# Patient Record
Sex: Female | Born: 1985 | Race: Black or African American | Hispanic: No | Smoking: Never smoker
Health system: Southern US, Community
[De-identification: ages and names within clinical notes are randomized; demographics above are authoritative.]

## PROBLEM LIST (undated history)

## (undated) DIAGNOSIS — R519 Headache, unspecified: Secondary | ICD-10-CM

## (undated) DIAGNOSIS — T8859XA Other complications of anesthesia, initial encounter: Secondary | ICD-10-CM

## (undated) DIAGNOSIS — D219 Benign neoplasm of connective and other soft tissue, unspecified: Secondary | ICD-10-CM

## (undated) HISTORY — DX: Other complications of anesthesia, initial encounter: T88.59XA

## (undated) HISTORY — PX: MYOMECTOMY: SHX85

## (undated) HISTORY — DX: Headache, unspecified: R51.9

---

## 2015-06-15 DIAGNOSIS — D219 Benign neoplasm of connective and other soft tissue, unspecified: Secondary | ICD-10-CM

## 2015-06-15 DIAGNOSIS — O321XX0 Maternal care for breech presentation, not applicable or unspecified: Secondary | ICD-10-CM

## 2021-07-16 ENCOUNTER — Encounter: Payer: Self-pay | Admitting: Obstetrics and Gynecology

## 2021-07-16 ENCOUNTER — Telehealth (INDEPENDENT_AMBULATORY_CARE_PROVIDER_SITE_OTHER): Payer: Medicaid Other

## 2021-07-16 DIAGNOSIS — Z3A00 Weeks of gestation of pregnancy not specified: Secondary | ICD-10-CM

## 2021-07-16 DIAGNOSIS — G43909 Migraine, unspecified, not intractable, without status migrainosus: Secondary | ICD-10-CM | POA: Insufficient documentation

## 2021-07-16 DIAGNOSIS — Z98891 History of uterine scar from previous surgery: Secondary | ICD-10-CM | POA: Insufficient documentation

## 2021-07-16 DIAGNOSIS — Z3480 Encounter for supervision of other normal pregnancy, unspecified trimester: Secondary | ICD-10-CM

## 2021-07-16 MED ORDER — BLOOD PRESSURE MONITORING DEVI: 1.0000 | 0 refills | Status: DC

## 2021-07-16 NOTE — Patient Instructions (Signed)
AREA PEDIATRIC/FAMILY PRACTICE PHYSICIANS ? ?[place] ([address]) ?[facility] ?Chambliss, MD; Eniola, MD; Hale, MD; Hensel, MD; McDiarmid, MD; McIntyer, MD; Neilah Fulwider, MD; Walden, MD ?[address] [place], [place] [address] ?[phone] ?Mon-Fri 8:30-12:30, 1:30-5:00 ?Providers come to see babies at [facility] ?Accepting Medicaid ?[facility] ?Limited providers who accept newborns: Koirala, MD; Morrow, MD; Wolters, MD ?[address], [place], [place] [address] ?[phone] ?Mon-Fri 8:00-5:30 ?Babies seen by providers at [facility] ?Does NOT accept Medicaid ?Please call early in hospitalization for appointment (limited availability)  ?[facility] ?Mulberry, MD ?[address] [place], [place] [address] ?[phone] ?Mon, Tue, Thur, Fri 8:30-5:00, Wed 10:00-7:00 (closed 1-2pm) ?Babies seen by [facility] providers ?Accepting Medicaid ?[facility] Pediatric[facility] ?Rubin, MD ?[address], [place], [place] [address] ?[phone] ?Mon-Fri 8:30-5:00, Sat 8:30-12:00 ?Provider comes to see babies at [facility] ?Accepting Medicaid ?Must have been referred from current patients or contacted office prior to delivery ?[facility] ([facility]) ?Brown, MD; Chandler, MD; Ettefagh, MD; Grant, MD; Lester, MD; McCormick, MD; McQueen, MD; Prose, MD; Simha, MD; Stanley, MD; Stryffeler, NP; Tebben, NP ?[address], [place], [place] [address] ?[phone] ?Mon, Tue, Thur, Fri 8:30-5:30, Wed 9:30-5:30, Sat 8:30-12:30 ?Babies seen by [facility] providers ?Accepting Medicaid ?Only accepting infants of first-time parents or siblings of current patients ?Hospital discharge coordinator will make follow-up appointment ?Jack Amos ?[address], [place], [place]  [address] ?[phone]   Fax - [phone] ?[facility] ?[address], [place], [place]  [address] ?Phone - [phone]   Fax - [phone] ?Shilpa Gosrani ?[address], [place], [place]  [address] ?[phone] ? ?[place] ([address]) ?[facility] ?Bates, MD; [place], MD; Cooper, Cox, MD; MD; Davis, MD; Dovico, MD; Ettefaugh, MD; Little, MD; Lowe, MD; Keiffer, MD; Melvin, MD; Sumner, MD; Williams, MD ?[address], [place], [place] [address] ?[phone] ?Mon-Fri 8:30-5:00 (extended evenings Mon-Thur as needed), Sat-Sun 10:00-1:00 ?Providers come to see babies at [facility] ?Accepting Medicaid for families of first-time babies and families with all children in the household age 3 and under. Must register with office prior to making appointment (M-F only). ?[facility] ?Henson, NP; Knapp, MD; Lalonde, MD; Tysinger, PA ?[address] [place], [place] [address] ?[phone] ?Mon-Fri 8:00-5:00 ?Babies seen by providers at [facility] ?Does NOT accept Medicaid/Commercial Insurance Only ?[facility] ([facility])  ?Artis, MD; Barnes, MD; Bratton, MD; Coccaro, MD; Lockett Gardner, MD; Kramer, MD; Marshall, MD; Netherton, MD; Poleto, MD; Skinner, MD ?[address] [place], [place] [address] ?[phone] ?Mon-Fri 8:30-5:30, Sat (Oct.-Mar.) 9:00-1:00 ?Babies seen by providers at [facility] ?Accepting Medicaid ? ?[place] ([address]) ?[facility] ?Reid, MD; Warner, MD ?[address], [place], [place] [address] ?[phone] ?Mon-Fri 8:30-5:00, Sat 8:30-12:00 ?Providers come to see babies at [facility] ?Does NOT accept Medicaid ?[facility] ?Becker, PA; Hagler, MD; Scifres, PA; Sun, MD; Swayne, MD ?[address], [place],  [address] ?[phone] ?Mon-Fri 8:00-5:00 ?Babies seen by providers at [facility] ?Does NOT accept Medicaid ?Only accepting babies of parents who  are patients ?Please call early in hospitalization for appointment (limited availability) ?[facility] ?Clark, MD; Frye, MD; Kelleher, MD; Mack, NP; Miller, MD; O'Keller, MD; Patterson, NP; Pudlo, MD; Puzio, MD; Thomas, MD; Tucker, MD; Twiselton, MD ?[address] [place], [place], [place] [address] ?[phone] ?Mon-Fri 8:00-5:00, Sat 9:00-12:00 ?Providers come to see babies at [facility] ?Does NOT accept Medicaid ? ?[facility] [phone]) ?[place] at [facility] ?Limited providers accepting new patients: Dayna Ramus, NP; [place], [place] ?[address], [place], [place] [address] ?[phone] ?Mon-Fri 8:00-5:00 ?Babies seen by providers at [facility] ?Does NOT accept Medicaid ?Only accepting babies of parents who are patients ?Please call early in hospitalization for appointment (limited availability) ?[facility] ?Abner Greenspan, MD; Sheran Lawless, MD ?[address] [place], [place] [address] ?[phone] (press [phone] to schedule appointment) ?Mon-Fri 8:00-5:00 ?Providers come to see babies at [facility] ?Does NOT accept Medicaid ?[facility] ?Mazer, MD ?[address] [place], [place] [address] ?[phone] ?Mon-Fri 8:30-5:00 (lunch 12:30-1:00), extended hours by appointment only Wed 5:00-6:30 ?Babies seen by [facility] providers ?Accepting Medicaid ?Therapist, music at [facility] ?Volanda Napoleon, MD; Martinique, MD; Ethlyn Gallery, MD ?[place], [place], [place] [address] ?[phone] ?Mon-Fri 8:00-5:00 ?Babies seen by [facility] providers ?Does NOT accept Medicaid ?Therapist, music at [employer] ?Jerline Pain, MD; Yong Channel, MD; [place], DO ?[address] [place], [place] [address] ?[phone] ?Mon-Fri 8:00-5:00 ?Babies seen by [facility] providers ?Does NOT accept Medicaid ?[facility] ?Erlene Quan, [place]; [place], [place]; Brunson, NP; Albertina Parr, MD; Frederic Jericho, MD; Ronney Lion, MD; Carlos Levering, NP; Jerelene Redden, NP; Tomasita Crumble, NP; Ronelle Nigh, NP; Corinna Lines, MD; Karsten Ro,  MD ?[address] [place], [place] [address] ?[phone] ?Mon-Fri 8:30-5:00, Sat 10:00-1:00 ?Providers come to see babies at [facility] ?Does NOT accept Medicaid ?Free prenatal information session Tuesdays at 4:45pm ?[facility] ?Coletta Memos, MD; Araceli Bouche, [place]; [place], [place]; [place], [place] ?[address] [place] [address] ?[phone] ?Mon-Fri 7:30-5:30 ?Babies seen by [facility] providers ?[facility] ?[address] [place], [place], [place]  [address] ?[phone]   Fax - [phone] ? ?[place] [phone] & [phone]) ?[place] ?Ayesha Rumpf, MD ?[address] [place], [place] [address] ?[phone] ?Mon-Thur 8:00-6:00 ?Providers come to see babies at [facility] ?Accepting Medicaid ?[place] ?Ouida Sills, NP; Melford Aase, MD; [place], [place]; [place], [place] ?[address] [place], [place] [address] ?[phone] ?Mon-Thur 7:30-7:30, Fri 7:30-4:30 ?Babies seen by [facility] providers ?Accepting Medicaid ?[facility] ?Carolynn Sayers, MD; Cristino Martes, NP; Gertie Baron, MD ?[address], [place], [place] [address] ?[phone] ?Mon-Fri 8:30-5:00, Sat 8:30-12:00 ?Providers come to see babies at [facility] ?Accepting Medicaid ?Must have ?Meet & Greet? appointment at office prior to delivery ?[place] [facility] ?Faythe Dingwall, MD; Juleen China, MD; Clydene Laming, MD ?[address], [place], [place] [address] ?[phone] ?Mon-Wed 8:00-6:00, Thur-Fri 8:00-5:00, Sat 9:00-12:00 ?Providers come to see babies at [facility] ?Does NOT accept Medicaid ?Only accepting siblings of current patients ?[facility]  ?[address], [place], [place], [place]  [address] ?[phone]   Fax - [phone] ?[place] at [facility] ?[place] [address], [place], [place]  [address] ?[phone]   Fax -  [phone] ? ?[place] [phone] & [phone]) ?Therapist, music at [employer] ?Harlin Heys, DO ?[address] [place], [place] [address] ?[phone] ?Mon-Fri 7:00-5:00 ?Babies seen by [facility]

## 2021-07-16 NOTE — Progress Notes (Signed)
Pt states is having problems sleeping due to headaches, it seems that the Kepra does not help. Pt states that he head feels very heavy also. ?

## 2021-07-16 NOTE — Progress Notes (Signed)
New OB Intake ? ?I connected with  Mackenzie Reeves on 07/16/21 at 11:15 AM EDT by MyChart Video Visit and verified that I am speaking with the correct person using two identifiers. Nurse is located at [facility] and pt is located at [employer]. ? ?I discussed the limitations, risks, security and privacy concerns of performing an evaluation and management service by telephone and the availability of in person appointments. I also discussed with the patient that there may be a patient responsible charge related to this service. The patient expressed understanding and agreed to proceed. ? ?I explained I am completing New OB Intake today. We discussed her EDD of 01/21/22 that is based on LMP of 04/16/21. Pt is G2/P1. I reviewed her allergies, medications, Medical/Surgical/OB history, and appropriate screenings. I informed her of [facility] services. Based on history, this is a/an  pregnancy uncomplicated .  ? ?There are no problems to display for this patient. ? ? ?Concerns addressed today ? ?Delivery Plans:  ?Plans to deliver at [facility].  ? ?MyChart/Babyscripts ?MyChart access verified. I explained pt will have some visits in office and some virtually. Babyscripts instructions given and order placed. Patient verifies receipt of registration text/e-mail. Account successfully created and app downloaded. ? ?Blood Pressure Cuff  ?Blood pressure cuff ordered for patient to pick-up from [employer]. Explained after first prenatal appt pt will check weekly and document in 36. ? ?Weight scale: Patient does / does not  have weight scale. Weight scale ordered for patient to pick up from [employer].  ? ?Anatomy US ?Explained first scheduled [place] will be around 19 weeks. Anatomy US scheduled for 08/29/21 at 1030. Pt notified to arrive at 1015. ?Scheduled AFP lab only appointment if CenteringPregnancy pt for same day as anatomy US.  ? ?Labs ?Discussed Johnsie Cancel genetic screening with patient. Would like both Panorama and Horizon drawn  at new OB visit.Also if interested in genetic testing, tell patient she will need AFP 15-21 weeks to complete genetic testing .Routine prenatal labs needed. ? ?Covid Vaccine ?Patient has covid vaccine.  ? ?Is patient a CenteringPregnancy candidate? Accepted  "Centering Patient" indicated on sticky note ?  ?Is patient a Mom+Baby Combined Care candidate? Not a candidate   Scheduled with Mom+Baby provider  ?  ?Is patient interested in [place]? No  "Interested in [employer] - Schedule next visit with CNM" on sticky note ? ?Informed patient of [facility] website  and placed link in her AVS.  ? ?Social Determinants of Health ?Food Insecurity: Patient denies food insecurity. ?WIC Referral: Patient is interested in referral to [facility].  ?Transportation: Patient denies transportation needs. ?Childcare: Discussed no children allowed at ultrasound appointments. Offered childcare services; patient declines childcare services at this time. ? ?Send link to Pregnancy Navigators ? ? ?Placed OB Box on problem list and updated ? ?First visit review ?I reviewed new OB appt with pt. I explained she will have a pelvic exam, ob bloodwork with genetic screening, and PAP smear. Explained pt will be seen by Dr. Damita Dunnings at first visit; encounter routed to appropriate provider. Explained that patient will be seen by pregnancy navigator following visit with provider. [facility] information placed in AVS.  ? ?Bethanne Ginger, CMA ?07/16/2021  11:36 AM  ?

## 2021-07-17 DIAGNOSIS — O3680X0 Pregnancy with inconclusive fetal viability, not applicable or unspecified: Secondary | ICD-10-CM | POA: Diagnosis not present

## 2021-07-17 DIAGNOSIS — Z3201 Encounter for pregnancy test, result positive: Secondary | ICD-10-CM | POA: Diagnosis not present

## 2021-07-17 DIAGNOSIS — Z23 Encounter for immunization: Secondary | ICD-10-CM | POA: Diagnosis not present

## 2021-07-17 DIAGNOSIS — Z3A12 12 weeks gestation of pregnancy: Secondary | ICD-10-CM | POA: Diagnosis not present

## 2021-07-18 DIAGNOSIS — Z368A Encounter for antenatal screening for other genetic defects: Secondary | ICD-10-CM | POA: Diagnosis not present

## 2021-07-18 DIAGNOSIS — Z3A12 12 weeks gestation of pregnancy: Secondary | ICD-10-CM | POA: Diagnosis not present

## 2021-07-18 DIAGNOSIS — Z3401 Encounter for supervision of normal first pregnancy, first trimester: Secondary | ICD-10-CM | POA: Diagnosis not present

## 2021-07-18 DIAGNOSIS — O09522 Supervision of elderly multigravida, second trimester: Secondary | ICD-10-CM | POA: Diagnosis not present

## 2021-07-18 DIAGNOSIS — Z124 Encounter for screening for malignant neoplasm of cervix: Secondary | ICD-10-CM | POA: Diagnosis not present

## 2021-07-18 DIAGNOSIS — O219 Vomiting of pregnancy, unspecified: Secondary | ICD-10-CM | POA: Diagnosis not present

## 2021-07-18 DIAGNOSIS — G43909 Migraine, unspecified, not intractable, without status migrainosus: Secondary | ICD-10-CM | POA: Diagnosis not present

## 2021-07-18 DIAGNOSIS — N898 Other specified noninflammatory disorders of vagina: Secondary | ICD-10-CM | POA: Diagnosis not present

## 2021-07-18 DIAGNOSIS — O09521 Supervision of elderly multigravida, first trimester: Secondary | ICD-10-CM | POA: Diagnosis not present

## 2021-07-18 DIAGNOSIS — Z113 Encounter for screening for infections with a predominantly sexual mode of transmission: Secondary | ICD-10-CM | POA: Diagnosis not present

## 2021-07-18 DIAGNOSIS — R87810 Cervical high risk human papillomavirus (HPV) DNA test positive: Secondary | ICD-10-CM | POA: Diagnosis not present

## 2021-07-18 DIAGNOSIS — Z369 Encounter for antenatal screening, unspecified: Secondary | ICD-10-CM | POA: Diagnosis not present

## 2021-07-18 LAB — OB RESULTS CONSOLE HEPATITIS B SURFACE ANTIGEN: Hepatitis B Surface Ag: NEGATIVE

## 2021-07-18 LAB — OB RESULTS CONSOLE ABO/RH: RH Type: NEGATIVE

## 2021-07-18 LAB — OB RESULTS CONSOLE HIV ANTIBODY (ROUTINE TESTING): HIV: NONREACTIVE

## 2021-07-18 LAB — OB RESULTS CONSOLE ANTIBODY SCREEN: Antibody Screen: NEGATIVE

## 2021-07-18 LAB — OB RESULTS CONSOLE GC/CHLAMYDIA
Chlamydia: NEGATIVE
Neisseria Gonorrhea: NEGATIVE

## 2021-07-18 LAB — OB RESULTS CONSOLE RUBELLA ANTIBODY, IGM: Rubella: IMMUNE

## 2021-07-18 LAB — OB RESULTS CONSOLE RPR: RPR: NONREACTIVE

## 2021-07-22 DIAGNOSIS — R519 Headache, unspecified: Secondary | ICD-10-CM | POA: Diagnosis not present

## 2021-07-22 DIAGNOSIS — G8929 Other chronic pain: Secondary | ICD-10-CM | POA: Diagnosis not present

## 2021-07-23 ENCOUNTER — Telehealth: Payer: Self-pay | Admitting: Advanced Practice Midwife

## 2021-07-23 DIAGNOSIS — R519 Headache, unspecified: Secondary | ICD-10-CM | POA: Diagnosis not present

## 2021-07-23 DIAGNOSIS — Z3A13 13 weeks gestation of pregnancy: Secondary | ICD-10-CM | POA: Diagnosis not present

## 2021-07-23 NOTE — Telephone Encounter (Signed)
Patient called in to cancel all her appointments she will no longer come here.   ?

## 2021-07-24 ENCOUNTER — Telehealth: Payer: Self-pay | Admitting: Hematology and Oncology

## 2021-07-24 NOTE — Telephone Encounter (Signed)
Scheduled appt per 3/23 referral. Pt is aware of appt date and time. Pt is aware to arrive 15 mins prior to appt time and to bring and updated insurance card. Pt is aware of appt location.   ?

## 2021-07-28 DIAGNOSIS — G43719 Chronic migraine without aura, intractable, without status migrainosus: Secondary | ICD-10-CM | POA: Diagnosis not present

## 2021-07-28 DIAGNOSIS — Z79899 Other long term (current) drug therapy: Secondary | ICD-10-CM | POA: Diagnosis not present

## 2021-07-29 ENCOUNTER — Encounter: Payer: Medicaid Other | Admitting: Obstetrics and Gynecology

## 2021-08-01 DIAGNOSIS — G518 Other disorders of facial nerve: Secondary | ICD-10-CM | POA: Diagnosis not present

## 2021-08-01 DIAGNOSIS — G43719 Chronic migraine without aura, intractable, without status migrainosus: Secondary | ICD-10-CM | POA: Diagnosis not present

## 2021-08-01 DIAGNOSIS — M542 Cervicalgia: Secondary | ICD-10-CM | POA: Diagnosis not present

## 2021-08-01 DIAGNOSIS — M791 Myalgia, unspecified site: Secondary | ICD-10-CM | POA: Diagnosis not present

## 2021-08-08 ENCOUNTER — Other Ambulatory Visit: Payer: Self-pay

## 2021-08-08 ENCOUNTER — Inpatient Hospital Stay: Payer: Medicaid Other | Attending: Hematology and Oncology | Admitting: Hematology and Oncology

## 2021-08-08 ENCOUNTER — Encounter: Payer: Self-pay | Admitting: Hematology and Oncology

## 2021-08-08 ENCOUNTER — Inpatient Hospital Stay: Payer: Medicaid Other

## 2021-08-08 VITALS — BP 131/78 | HR 88 | Temp 97.9°F | Resp 16 | Ht 65.0 in | Wt 159.8 lb

## 2021-08-08 DIAGNOSIS — D582 Other hemoglobinopathies: Secondary | ICD-10-CM | POA: Insufficient documentation

## 2021-08-08 DIAGNOSIS — Z3A15 15 weeks gestation of pregnancy: Secondary | ICD-10-CM | POA: Insufficient documentation

## 2021-08-08 DIAGNOSIS — R519 Headache, unspecified: Secondary | ICD-10-CM | POA: Diagnosis not present

## 2021-08-08 DIAGNOSIS — Z79899 Other long term (current) drug therapy: Secondary | ICD-10-CM | POA: Insufficient documentation

## 2021-08-08 NOTE — Progress Notes (Signed)
[name] ?CONSULT NOTE ? ?Patient Care Team: ?[name], DO as PCP - General (Obstetrics and Gynecology) ? ?CHIEF COMPLAINTS/PURPOSE OF CONSULTATION:  ?Hemoglobinopathy ? ?ASSESSMENT & PLAN:  ? 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 ?We have discussed that hemoglobin C trait is a hemoglobinopathy which essentially causes a carrier state and is clinically asymptomatic.  We however recommend that the partner be tested because of the partner also carries a variant, the children may have hemoglobin C disease which can occasionally cause mild hemolytic anemia with high MCHC as well as gallstones, splenomegaly, jaundice etc. ?She tells me that her partner is back in [place] and they are legally separated.  She is not sure if her partner can get tested for hemoglobin C trait.  In that case she could consider testing the child after his bone for hemoglobin C.  Typically hemoglobin C does not cause vaso-occlusive events like hemoglobin S.  And if we do see any vaso-occlusive events in patients with hemoglobin C disease, we should consider evaluating them for sickle cell disease as well as hemoglobin C [place]. ?She does not need any further evaluation or treatment at this time.  She understands the risks of hemoglobin C trait as well as hemoglobin C disease.  She will talk to her partner back in [place]. ?She otherwise we will try to get the child tested after birth. ? ?No evidence of anemia which is typical for carrier state and hemoglobin C.   ?She does not need routine follow-ups with hematology.  She can return to clinic as needed. ?Thank you for consulting [place] the care of this patient.  Please not hesitate contact us with any additional questions or concerns. ? ?HISTORY OF PRESENTING ILLNESS:  ?Mackenzie Reeves 36 y.o. female is here because of Hb C trait ? 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  Patient arrived to the appointment today by herself.  She also has a 19-year-old son who is healthy.  She is doing very well from the pregnancy standpoint.  She has history of headaches for which she takes Keppra.  She otherwise denies any major health problems with her first pregnancy, needed a C-section.  No known history of anemia.  Rest of the pertinent 10 point ROS reviewed and negative. ? ?REVIEW OF SYSTEMS:   ?Constitutional: Denies fevers, chills or abnormal night sweats ?Eyes: Denies blurriness of vision, double vision or watery eyes ?Ears, nose, mouth, throat, and face: Denies mucositis or sore throat ?Respiratory: Denies cough, dyspnea or wheezes ?Cardiovascular: Denies palpitation, chest discomfort or lower extremity swelling ?Gastrointestinal:  Denies nausea, heartburn or change in bowel habits ?Skin: Denies abnormal skin rashes ?Lymphatics: Denies new lymphadenopathy or easy bruising ?Neurological:Denies numbness, tingling or new weaknesses ?Behavioral/Psych: Mood is stable, no new changes  ?All other systems were reviewed with the patient and are negative. ? ?MEDICAL HISTORY:  ?Past Medical History:  ?Diagnosis Date  ? Complication of anesthesia   ? Headache   ? ? ?SURGICAL HISTORY: ?Past Surgical History:  ?Procedure Laterality Date  ? CESAREAN SECTION    ? ? ?SOCIAL HISTORY: ?Social History  ? ?Socioeconomic History  ? Marital status: Legally Separated  ?  Spouse name: Not on file  ?  Number of children: Not on file  ? Years of education: Not on file  ? Highest education level: Not on file  ?Occupational History  ? Not on file  ?Tobacco Use  ? Smoking status: Never  ?  Passive exposure: Never  ? Smokeless tobacco: Never  ?Vaping Use  ? Vaping Use: Never used  ?Substance and Sexual  Activity  ? Alcohol use: Never  ? Drug use: Never  ? Sexual activity: Yes  ?  Birth control/protection: None  ?Other Topics Concern  ? Not on file  ?Social History Narrative  ? Not on file  ? ?Social Determinants of Health  ? ?Financial Resource Strain: Not on file  ?Food Insecurity: Not on file  ?Transportation Needs: Not on file  ?Physical Activity: Not on file  ?Stress: Not on file  ?Social Connections: Not on file  ?Intimate Partner Violence: Not on file  ? ? ?FAMILY HISTORY: ?Family History  ?Problem Relation Age of Onset  ? Diabetes Mother   ? ? ?ALLERGIES:  is allergic to diclofenac. ? ?MEDICATIONS:  ?Current Outpatient Medications  ?Medication Sig Dispense Refill  ? acetaminophen (TYLENOL) 325 MG tablet Take 650 mg by mouth every 6 (six) hours as needed.    ? Blood Pressure Monitoring DEVI 1 each by Does not apply route once a week. 1 each 0  ? levETIRAcetam (KEPPRA) 500 MG tablet Take 500 mg by mouth 2 (two) times daily.    ? Prenatal Vit-Fe Fumarate-FA (MULTIVITAMIN-PRENATAL) 27-0.8 MG TABS tablet Take 1 tablet by mouth daily at 12 noon.    ? ?No current facility-administered medications for this visit.  ? ? ? ?PHYSICAL EXAMINATION: ?ECOG PERFORMANCE STATUS: 0 - Asymptomatic ? ?Vitals:  ? 08/08/21 1058  ?BP: 131/78  ?Pulse: 88  ?Resp: 16  ?Temp: 97.9 ?F (36.6 ?C)  ?SpO2: 100%  ? ?Filed Weights  ? 08/08/21 1058  ?Weight: 159 lb 12.8 oz (72.5 kg)  ? ? ?GENERAL:alert, no distress and comfortable ?SKIN: skin color, texture, turgor are normal, no rashes or significant lesions ?EYES: normal, conjunctiva are pink and non-injected, sclera clear ?OROPHARYNX:no exudate, no erythema and lips, buccal mucosa, and tongue normal  ?NECK: supple, thyroid normal size, non-tender, without nodularity ?LYMPH:  no palpable lymphadenopathy in the cervical, axillary or inguinal ?LUNGS: clear to auscultation and percussion with normal breathing effort ?HEART: regular rate & rhythm and no murmurs and no lower extremity  edema ?ABDOMEN: Gravid uterus, no hepatosplenomegaly  ?musculoskeletal:no cyanosis of digits and no clubbing  ?PSYCH: alert & oriented x 3 with fluent speech ?NEURO: no focal motor/sensory deficits ? ?LABORATORY DATA:  ?I have reviewed the data as listed ?No results found for: WBC, HGB, HCT, MCV, PLT ?  Chemistry   ?No results found for: NA, K, CL, CO2, BUN, CREATININE, GLU No results found for: CALCIUM, ALKPHOS, AST, ALT, BILITOT  ? ?Her most recent hemoglobin electrophoresis from March 17 showed hemoglobin A at 57.1% hemoglobin A2 at 3.6% and hemoglobin C at 38.3%.  Final interpretation showed hemoglobin pattern and concentration consistent with hemoglobin C trait, heterozygous. ? ?CBC showed a hemoglobin of 12.3, normal MCV, normal WBC and platelet count ? ?RADIOGRAPHIC STUDIES: ?I have personally reviewed the radiological images as listed and agreed with the findings in the report. ?No results found. ? ?All questions were answered. The patient knows to call the clinic with any problems, questions or concerns. ?I spent 45 minutes in the care of this patient including H and P, review of records, counseling and coordination  of care. ? ?  ? Benay Pike, MD ?08/08/2021 11:08 AM ? ? ? ?

## 2021-08-27 DIAGNOSIS — G4459 Other complicated headache syndrome: Secondary | ICD-10-CM | POA: Diagnosis not present

## 2021-08-27 DIAGNOSIS — Z3A18 18 weeks gestation of pregnancy: Secondary | ICD-10-CM | POA: Diagnosis not present

## 2021-08-27 DIAGNOSIS — R9401 Abnormal electroencephalogram [EEG]: Secondary | ICD-10-CM | POA: Diagnosis not present

## 2021-08-29 ENCOUNTER — Ambulatory Visit (HOSPITAL_COMMUNITY)
Admission: RE | Admit: 2021-08-29 | Discharge: 2021-08-29 | Disposition: A | Discharge status: Home / Self Care | Payer: Medicaid Other | Source: Ambulatory Visit | Attending: Neurology | Admitting: Neurology

## 2021-08-29 ENCOUNTER — Other Ambulatory Visit (HOSPITAL_COMMUNITY): Payer: Self-pay

## 2021-08-29 ENCOUNTER — Ambulatory Visit: Payer: Medicaid Other

## 2021-08-29 ENCOUNTER — Other Ambulatory Visit: Payer: Medicaid Other

## 2021-08-29 DIAGNOSIS — R519 Headache, unspecified: Secondary | ICD-10-CM | POA: Diagnosis not present

## 2021-08-29 NOTE — Progress Notes (Signed)
EEG complete - results pending 

## 2021-08-29 NOTE — Procedures (Signed)
Patient Name: Mackenzie Reeves  ?MRN: [id number]  ?Epilepsy Attending: Lora Havens  ?Referring Physician/Provider: Melburn Popper, MD ?Date: 08/29/2021  ?Duration: 26.08 mins ? ?Patient history: 36yo F on keppra reportedly for headache. EEG to evaluate for seizure ? ?Level of alertness: Awake ? ?AEDs during EEG study: LEV ? ?Technical aspects: This EEG study was done with scalp electrodes positioned according to the 10-20 International system of electrode placement. Electrical activity was acquired at a sampling rate of '500Hz'$  and reviewed with a high frequency filter of '70Hz'$  and a low frequency filter of '1Hz'$ . EEG data were recorded continuously and digitally stored.  ? ?Description: The posterior dominant rhythm consists of '9Hz'$  activity of moderate voltage (25-35 uV) seen predominantly in posterior head regions, symmetric and reactive to eye opening and eye closing. Physiologic photic driving was seen during photic stimulation.  Hyperventilation was not performed.    ? ?IMPRESSION: ?This study is within normal limits. No seizures or epileptiform discharges were seen throughout the recording. ? ?Lora Havens  ? ?

## 2021-09-11 DIAGNOSIS — R300 Dysuria: Secondary | ICD-10-CM | POA: Diagnosis not present

## 2021-09-12 DIAGNOSIS — G4459 Other complicated headache syndrome: Secondary | ICD-10-CM | POA: Diagnosis not present

## 2021-09-12 DIAGNOSIS — Z3A18 18 weeks gestation of pregnancy: Secondary | ICD-10-CM | POA: Diagnosis not present

## 2021-09-16 DIAGNOSIS — R87612 Low grade squamous intraepithelial lesion on cytologic smear of cervix (LGSIL): Secondary | ICD-10-CM | POA: Diagnosis not present

## 2021-09-16 DIAGNOSIS — Z363 Encounter for antenatal screening for malformations: Secondary | ICD-10-CM | POA: Diagnosis not present

## 2021-09-16 DIAGNOSIS — N72 Inflammatory disease of cervix uteri: Secondary | ICD-10-CM | POA: Diagnosis not present

## 2021-09-16 DIAGNOSIS — Z3A21 21 weeks gestation of pregnancy: Secondary | ICD-10-CM | POA: Diagnosis not present

## 2021-09-16 DIAGNOSIS — O34219 Maternal care for unspecified type scar from previous cesarean delivery: Secondary | ICD-10-CM | POA: Diagnosis not present

## 2021-09-16 DIAGNOSIS — O09522 Supervision of elderly multigravida, second trimester: Secondary | ICD-10-CM | POA: Diagnosis not present

## 2021-09-21 ENCOUNTER — Inpatient Hospital Stay (HOSPITAL_COMMUNITY)
Admission: AD | Admit: 2021-09-21 | Discharge: 2021-09-21 | Disposition: A | Discharge status: Home / Self Care | Payer: Medicaid Other | Attending: Obstetrics and Gynecology | Admitting: Obstetrics and Gynecology

## 2021-09-21 ENCOUNTER — Inpatient Hospital Stay (HOSPITAL_BASED_OUTPATIENT_CLINIC_OR_DEPARTMENT_OTHER): Payer: Medicaid Other

## 2021-09-21 ENCOUNTER — Other Ambulatory Visit: Payer: Self-pay

## 2021-09-21 ENCOUNTER — Encounter (HOSPITAL_COMMUNITY): Payer: Self-pay | Admitting: Obstetrics and Gynecology

## 2021-09-21 DIAGNOSIS — Z3A21 21 weeks gestation of pregnancy: Secondary | ICD-10-CM | POA: Diagnosis not present

## 2021-09-21 DIAGNOSIS — R0781 Pleurodynia: Secondary | ICD-10-CM | POA: Diagnosis not present

## 2021-09-21 DIAGNOSIS — O9A213 Injury, poisoning and certain other consequences of external causes complicating pregnancy, third trimester: Secondary | ICD-10-CM | POA: Insufficient documentation

## 2021-09-21 DIAGNOSIS — O09522 Supervision of elderly multigravida, second trimester: Secondary | ICD-10-CM | POA: Insufficient documentation

## 2021-09-21 DIAGNOSIS — S8992XA Unspecified injury of left lower leg, initial encounter: Secondary | ICD-10-CM | POA: Insufficient documentation

## 2021-09-21 DIAGNOSIS — R109 Unspecified abdominal pain: Secondary | ICD-10-CM | POA: Diagnosis not present

## 2021-09-21 DIAGNOSIS — W07XXXA Fall from chair, initial encounter: Secondary | ICD-10-CM | POA: Diagnosis not present

## 2021-09-21 DIAGNOSIS — O321XX0 Maternal care for breech presentation, not applicable or unspecified: Secondary | ICD-10-CM

## 2021-09-21 DIAGNOSIS — S8990XA Unspecified injury of unspecified lower leg, initial encounter: Secondary | ICD-10-CM | POA: Diagnosis not present

## 2021-09-21 DIAGNOSIS — Y929 Unspecified place or not applicable: Secondary | ICD-10-CM | POA: Diagnosis not present

## 2021-09-21 DIAGNOSIS — O26892 Other specified pregnancy related conditions, second trimester: Secondary | ICD-10-CM

## 2021-09-21 DIAGNOSIS — O9A212 Injury, poisoning and certain other consequences of external causes complicating pregnancy, second trimester: Secondary | ICD-10-CM

## 2021-09-21 DIAGNOSIS — Z3480 Encounter for supervision of other normal pregnancy, unspecified trimester: Secondary | ICD-10-CM

## 2021-09-21 MED ORDER — CYCLOBENZAPRINE HCL 10 MG PO TABS: 10.0000 mg | ORAL_TABLET | Freq: Three times a day (TID) | ORAL | 0 refills | Status: DC | PRN

## 2021-09-21 NOTE — MAU Provider Note (Signed)
Chief Complaint:  Fall   Event Date/Time   First Provider Initiated Contact with Patient 09/21/21 1127     HPI: Mackenzie Reeves is a 36 y.o. G2P1001 at 61w5dwho presents to maternity admissions reporting falling off a chair this morning at 7:30. Landed hard on her left knee and left side. Denies hitting her abdomen. Denies vaginal bleeding, LOF. States she did not hear any pop or snapping sensations. Able to ambulate but limps due to discomfort.   Location: Left side, left anterior knee.  Quality: sore Severity: 7/10 in pain scale Duration: few hours Context: after fall  Timing: constant Modifying factors: Hasn't tried anything for the pain.  Associated signs and symptoms: Neg for VB, LOF, contractions. Good fetal movement.   Pregnancy Course:  Uncomplicated    Past Medical History:  Diagnosis Date   Complication of anesthesia    Headache    OB History  Gravida Para Term Preterm AB Living  '2 1 1     1  '$ SAB IAB Ectopic Multiple Live Births          1    # Outcome Date GA Lbr Len/2nd Weight Sex Delivery Anes PTL Lv  2 Current           1 Term 06/15/15     CS-Unspec  N LIV     Complications: Fibroids, Breech presentation   Past Surgical History:  Procedure Laterality Date   CESAREAN SECTION     Family History  Problem Relation Age of Onset   Diabetes Mother    Social History   Tobacco Use   Smoking status: Never    Passive exposure: Never   Smokeless tobacco: Never  Vaping Use   Vaping Use: Never used  Substance Use Topics   Alcohol use: Never   Drug use: Never   Allergies  Allergen Reactions   Nsaids Shortness Of Breath, Swelling and Rash    Facial swelling, difficulty breathing, rashes   Diclofenac Hives, Itching and Rash   Medications Prior to Admission  Medication Sig Dispense Refill Last Dose   folic acid (FOLVITE) 1 MG tablet Take 1 mg by mouth daily.      acetaminophen (TYLENOL) 325 MG tablet Take 650 mg by mouth every 6 (six) hours as needed.       Blood Pressure Monitoring DEVI 1 each by Does not apply route once a week. 1 each 0    levETIRAcetam (KEPPRA) 500 MG tablet Take 500 mg by mouth 2 (two) times daily.      Prenatal Vit-Fe Fumarate-FA (MULTIVITAMIN-PRENATAL) 27-0.8 MG TABS tablet Take 1 tablet by mouth daily at 12 noon.       I have reviewed patient's Past Medical Hx, Surgical Hx, Family Hx, Social Hx, medications and allergies.   ROS:  Review of Systems  Physical Exam  Patient Vitals for the past 24 hrs:  BP Temp Temp src Pulse Resp SpO2 Weight  09/21/21 1052 (!) 106/56 98.7 F (37.1 C) Oral 78 20 100 % --  09/21/21 1046 -- -- -- -- -- -- 75.4 kg   Constitutional: Well-developed, well-nourished female in no acute distress. Mild distress w/ mvmt.  Cardiovascular: normal rate Respiratory: normal effort GI: Abd soft, non-tender, gravid appropriate for gestational age. Left ribs tender. Mild bruising and abrasion noted. No point tenderness.  MS: Left knee moderately tender anteriorly, mild edema, normal ROM Neurologic: Alert and oriented x 4.  GU: Neg CVAT.  Pelvic: deferred    FHT:  152 by  doppler   Labs: No results found for this or any previous visit (from the past 24 hour(s)).  Imaging:  [place] MFM OB Limited  Result Date: 09/21/2021 ----------------------------------------------------------------------  OBSTETRICS REPORT                        (Signed Final 09/21/2021 09:20 pm) ---------------------------------------------------------------------- Patient Info  ID #:       [id number]                          D.O.B.:  04-19-1986 (35 yrs)  Name:       Mackenzie Reeves                     Visit Date: 09/21/2021 12:36 pm ---------------------------------------------------------------------- Performed By  Attending:        Sander Nephew      Secondary Phy.:    Manya Silvas                    MD                                                              CNM  Performed By:     Georgie Chard        Address:           [place]                                                              [place]  Referred By:      [facility] MAU/Triage         [facility]                                                              [place] ---------------------------------------------------------------------- Orders  #  Description  Code        Ordered By  1  [place] MFM OB LIMITED                     [id number]    Manya Silvas ----------------------------------------------------------------------  #  Order #                     Accession #                Episode #  1  [id number]                   [id number]                 [id number] ---------------------------------------------------------------------- Indications  [redacted] weeks gestation of pregnancy                 Z3A.21  Traumatic injury during pregnancy (Fall)        O9A.219 T14.90 ---------------------------------------------------------------------- Fetal Evaluation  Num Of Fetuses:          1  Fetal Heart Rate(bpm):   152  Cardiac Activity:        Observed  Presentation:            Breech  Placenta:                Anterior  P. Cord Insertion:       Visualized  Amniotic Fluid  AFI FV:      Within normal limits                              Largest Pocket(cm)                              4.5  Comment:    No placental abruption or previa identified. ---------------------------------------------------------------------- Biometry  LV:        5.4  mm ---------------------------------------------------------------------- OB History  Gravidity:    2         Term:   1        Prem:   0        SAB:   0  TOP:          0       Ectopic:  0        Living: 1 ---------------------------------------------------------------------- Gestational Age  LMP:           22w 4d        Date:  04/16/21                 EDD:    01/21/22  Clinical EDD:  Mackenzie Reeves 5d                                        EDD:   01/27/22  Best:          21w 5d     Det. By:  Clinical EDD             EDD:   01/27/22 ---------------------------------------------------------------------- Anatomy  Cranium:               Visualized             Stomach:  Visualized  Ventricles:            Visualized             Abdomen:                Visualized  Thoracic:              Visualized             Abdominal Wall:         Visualized  Heart:                 Visualized             Kidneys:                Visualized  Diaphragm:             Visualized             Bladder:                Visualized ---------------------------------------------------------------------- Cervix Uterus Adnexa  Cervix  Length:           3.47  cm.  Normal appearance by transabdominal scan.  Uterus  Single fibroid noted, see table below.  Right Ovary  Within normal limits.  Left Ovary  Within normal limits.  Cul De Sac  No free fluid seen.  Adnexa  No abnormality visualized. ---------------------------------------------------------------------- Myomas  Site                     L(cm)      W(cm)       D(cm)      Location  RT Posterior             4.11       2.77        3.45 ----------------------------------------------------------------------  Blood Flow                  RI       PI       Comments ---------------------------------------------------------------------- Impression  Limited exam due to maternal injury  Good fetal movement and amniotic fluid  No evidence of placental abruption or previa. ---------------------------------------------------------------------- Recommendations  Clinical correlation recommended. ----------------------------------------------------------------------              Sander Nephew, MD Electronically Signed Final Report   09/21/2021 09:20 pm ----------------------------------------------------------------------   MAU Course: Orders Placed This Encounter   Procedures   [place] MFM OB Limited   Discharge patient   Meds ordered this encounter  Medications   cyclobenzaprine (FLEXERIL) 10 MG tablet    Sig: Take 1 tablet (10 mg total) by mouth 3 (three) times daily as needed for muscle spasms.    Dispense:  20 tablet    Refill:  0    Order Specific Question:   Supervising Provider    Answer:   Aletha Halim [[id number]]    MDM: - Left sided pain and left knee pain after fall from chair. No clinical ir US evidence of abruption or other emergent condition. Abruption precautions reviewed. No evidence of significant knee injury. Recommend OP Ortho F/U PRN if Sx do not improve after 72 hours or of knee feels unstable and unable to bear weight. Rest, Ice, elevate, Flexeril PRN  Assessment: 1. Supervision of other normal pregnancy, antepartum   2. Traumatic injury during pregnancy in second trimester   3. Abdominal pain during pregnancy, second trimester   4. Rib pain on left side   5.  Traumatic injury of knee   6. [redacted] weeks gestation of pregnancy     Plan: Discharge home in stable condition.  Preterm Labor precautions and fetal kick counts  Follow-up Information     [facility], [facility] Follow up.   Why: Routine prenatal visit or sooner as needed if symptoms worsen Contact information: [address]  [address] [place] [address] [phone]         [facility] Follow up.   Specialty: Obstetrics and Gynecology Why: As needed in pregnancy emergencies Contact information: [address] [id number] [place] [phone]        orthopedist Follow up.   Why: As needed if musculoskeltal symptoms worsen                Allergies as of 09/21/2021       Reactions   Nsaids Shortness Of Breath, Swelling, Rash   Facial swelling, difficulty breathing, rashes   Diclofenac Hives, Itching, Rash        Medication List     TAKE these medications     acetaminophen 325 MG tablet Commonly known as: TYLENOL Take 650 mg by mouth every 6 (six) hours as needed.   Blood Pressure Monitoring Devi 1 each by Does not apply route once a week.   cyclobenzaprine 10 MG tablet Commonly known as: FLEXERIL Take 1 tablet (10 mg total) by mouth 3 (three) times daily as needed for muscle spasms.   folic acid 1 MG tablet Commonly known as: FOLVITE Take 1 mg by mouth daily.   levETIRAcetam 500 MG tablet Commonly known as: KEPPRA Take 500 mg by mouth 2 (two) times daily.   multivitamin-prenatal 27-0.8 MG Tabs tablet Take 1 tablet by mouth daily at 12 noon.        Tamala Julian, [place], [place] 09/21/2021 1:42 PM

## 2021-09-21 NOTE — MAU Note (Signed)
Mackenzie Reeves is a 36 y.o. at 76w4dhere in MAU reporting: she fell off a chair at 0730 this morning landing onto her left side, denies striking abdomen.  Denies VB or LOF.  Reports no FM since fall.  Onset of complaint: today Pain score: 7/10 left side & 6/10 left knee There were no vitals filed for this visit.   FHT: 152 bpm Lab orders placed from triage:   None

## 2021-09-24 DIAGNOSIS — G4459 Other complicated headache syndrome: Secondary | ICD-10-CM | POA: Diagnosis not present

## 2021-10-07 DIAGNOSIS — G44221 Chronic tension-type headache, intractable: Secondary | ICD-10-CM | POA: Diagnosis not present

## 2021-10-07 DIAGNOSIS — Z3A24 24 weeks gestation of pregnancy: Secondary | ICD-10-CM | POA: Diagnosis not present

## 2021-11-05 DIAGNOSIS — Z365 Encounter for antenatal screening for isoimmunization: Secondary | ICD-10-CM | POA: Diagnosis not present

## 2021-11-05 DIAGNOSIS — Z3A28 28 weeks gestation of pregnancy: Secondary | ICD-10-CM | POA: Diagnosis not present

## 2021-11-05 DIAGNOSIS — Z3689 Encounter for other specified antenatal screening: Secondary | ICD-10-CM | POA: Diagnosis not present

## 2021-11-05 DIAGNOSIS — Z23 Encounter for immunization: Secondary | ICD-10-CM | POA: Diagnosis not present

## 2021-11-20 ENCOUNTER — Encounter: Payer: Self-pay | Admitting: *Deleted

## 2021-11-21 DIAGNOSIS — R519 Headache, unspecified: Secondary | ICD-10-CM | POA: Diagnosis not present

## 2021-11-21 DIAGNOSIS — F419 Anxiety disorder, unspecified: Secondary | ICD-10-CM | POA: Diagnosis not present

## 2021-11-21 DIAGNOSIS — Z3493 Encounter for supervision of normal pregnancy, unspecified, third trimester: Secondary | ICD-10-CM | POA: Diagnosis not present

## 2021-11-26 DIAGNOSIS — F331 Major depressive disorder, recurrent, moderate: Secondary | ICD-10-CM | POA: Diagnosis not present

## 2021-12-02 ENCOUNTER — Encounter: Payer: Self-pay | Admitting: *Deleted

## 2021-12-04 NOTE — Progress Notes (Signed)
Referring:  Melburn Popper, MD [address] [place] [employer],  [place] [address]  PCP: [name], DO  Neurology was asked to evaluate Mackenzie Reeves, a 36 year old female for a chief complaint of headaches.  Our recommendations of care will be communicated by shared medical record.    CC:  headaches  History provided from self  HPI:  Medical co-morbidities: none  The patient presents for evaluation of headaches which began in 2000. Headaches have worsened since she became pregnant. She is currently [redacted] weeks pregnant with C-section planned for 01/06/22.  Headaches are described as heaviness in her vertex with associated blurred vision, photophobia, phonophobia, and nausea. She will sometimes feel like bugs are crawling on her scalp. She currently has headaches every day.  MRI and MRV brain were unremarkable.   Was previously placed on Keppra and Tegretol for abnormal EEG done in [place]. Repeat EEG on 08/29/21 was normal. Keppra and Tegretol were discontinued.  She went to the [facility] and was recommended to have trigger point injections and occipital nerve blocks. These were too painful for her to tolerate so she stopped them. Takes Zofran as needed for nausea.  Headache History: Onset: 2000 Aura: blurred Location: vertex Quality/Description: heaviness, pressure Associated Symptoms:  Photophobia: yes  Phonophobia: no  Nausea: yes Worse with activity?: yes Duration of headaches: hours  Headache days per month: 30 Headache free days per month: 0  Current Treatment: Abortive Tylenol Zofran  Preventative none  Prior Therapies                                 Keppra Tegretol amitriptyline Flexeril Naproxen Trigger point injections Occipital nerve block  LABS: N/a   IMAGING:  MRI brain 5/24 unremarkable MRV 09/12/21 unremarkable  Current Outpatient Medications on File Prior to Visit  Medication Sig Dispense Refill    acetaminophen (TYLENOL) 325 MG tablet Take 650 mg by mouth every 6 (six) hours as needed.     Blood Pressure Monitoring DEVI 1 each by Does not apply route once a week. 1 each 0   cyclobenzaprine (FLEXERIL) 10 MG tablet Take 1 tablet (10 mg total) by mouth 3 (three) times daily as needed for muscle spasms. 20 tablet 0   folic acid (FOLVITE) 1 MG tablet Take 1 mg by mouth daily.     Prenatal Vit-Fe Fumarate-FA (MULTIVITAMIN-PRENATAL) 27-0.8 MG TABS tablet Take 1 tablet by mouth daily at 12 noon.     No current facility-administered medications on file prior to visit.     Allergies: Allergies  Allergen Reactions   Nsaids Shortness Of Breath, Swelling and Rash    Facial swelling, difficulty breathing, rashes   Diclofenac Hives, Itching and Rash   Shrimp Extract Allergy Skin Test Rash    Family History: Family History  Problem Relation Age of Onset   Headache Mother      Past Medical History: Past Medical History:  Diagnosis Date   Complication of anesthesia    Headache     Past Surgical History Past Surgical History:  Procedure Laterality Date   CESAREAN SECTION     MYOMECTOMY      Social History: Social History   Tobacco Use   Smoking status: Never    Passive exposure: Never   Smokeless tobacco: Never  Vaping Use   Vaping Use: Never used  Substance Use Topics   Alcohol use: Never  Drug use: Never    ROS: Negative for fevers, chills. Positive for headaches. All other systems reviewed and negative unless stated otherwise in HPI.   Physical Exam:   Vital Signs: BP 119/78   Pulse (!) 107   Ht '5\' 5"'$  (1.651 m)   Wt 185 lb 6.4 oz (84.1 kg)   LMP 04/16/2021   BMI 30.85 kg/m  GENERAL: well appearing,in no acute distress,alert SKIN:  Color, texture, turgor normal. No rashes or lesions HEAD:  Normocephalic/atraumatic. CV:  RRR RESP: Normal respiratory effort  NEUROLOGICAL: Mental Status: Alert, oriented to person, place and time,Follows commands Cranial  Nerves: PERRL, visual fields intact to confrontation, extraocular movements intact, facial sensation intact, no facial droop or ptosis, hearing grossly intact, no dysarthria Motor: muscle strength 5/5 both upper and lower extremities,no drift, normal tone Reflexes: 2+ throughout Sensation: intact to light touch all 4 extremities Coordination: Finger-to- nose-finger intact bilaterally Gait: normal-based   IMPRESSION: 36 year old female who presents for evaluation of headaches. Her current headache pattern is consistent with chronic migraine. MRI and MRV were normal. Discussed that treatment options are limited during pregnancy. Will first try conservative treatment with supplements (Mg, B2) for prevention and home headache cocktail (tylenol, benadryl, zofran) for rescue. Referral to neck PT placed to help with cervicalgia. Can consider preventive medication like labetalol or Namenda if she continues to have severe refractory headaches despite supplements. Would avoid cyproheptadine as she is planning on breastfeeding and is due next month.  PLAN: -Prevention: Start magnesium oxide 400 mg daily, riboflavin 200 mg BID -Rescue: Take tylenol, benadryl, Zofran together as needed -Referral to neck PT -Next steps: Consider labetalol or Namenda for headache prevention if no improvement with supplements alone.   I spent a total of 29 minutes chart reviewing and counseling the patient. Headache education was done. Discussed treatment options including preventive and acute medications, natural supplements, and physical therapy.  Discussed medication side effects, adverse reactions and drug interactions. Written educational materials and patient instructions outlining all of the above were given.  Follow-up: 3 months   Genia Harold, MD 12/05/2021   11:00 AM

## 2021-12-05 ENCOUNTER — Encounter: Payer: Self-pay | Admitting: Psychiatry

## 2021-12-05 ENCOUNTER — Ambulatory Visit: Payer: Medicaid Other | Admitting: Psychiatry

## 2021-12-05 VITALS — BP 119/78 | HR 107 | Ht 65.0 in | Wt 185.4 lb

## 2021-12-05 DIAGNOSIS — M542 Cervicalgia: Secondary | ICD-10-CM | POA: Diagnosis not present

## 2021-12-05 DIAGNOSIS — G43019 Migraine without aura, intractable, without status migrainosus: Secondary | ICD-10-CM | POA: Diagnosis not present

## 2021-12-05 NOTE — Patient Instructions (Addendum)
Start magnesium oxide 400 mg daily and riboflavin 200 mg twice a day Referral to physical therapy Take Tylenol, nausea medication, and benadryl together at onset of headache  MIGRAINE AND PREGNANCY  Migraine is a complex neurological disorder with many triggers, including sex hormones! This means that some women may experience changes in headache frequency when they are pregnant. In general, the increasing levels of estrogen during pregnancy can be protective, and women may see an improvement in migraine in the 2nd and 3rd trimester. After delivery, women typically return to the pre-pregnancy migraine pattern.   Treatment of migraine in pregnancy is tricky because the list of SAFE medications in pregnancy does not necessarily match the list of EFFECTIVE medications in headache.  The most difficult time is when a woman is planning pregnancy and it is unknown whether she is actually pregnant. Unfortunately, to avoid fetal exposure to potentially harmful medications, it is necessary to stop most migraine preventive medications and avoid the use of typical prescribed and over-the-counter as-needed medications previously used for headache.   ACUTE Treatment of Migraine in Pregnancy - Usual abortive treatments such as triptans and NSAIDS are Category C in pregnancy.  - Acetaminophen (Tylenol) up to 500 mg is less commonly used for migraine, however, it is considered the safest analgesic to use in pregnancy (category B), and it can be effective if used early in a migraine, especially in conjunction with caffeine and metoclopramide (reglan), an anti-nausea medication. Benadryl can also be used in combination with these medications. - Imitrex can be used as a second line rescue treatment  PROPHYLACTIC Treatment of Migraine in Pregnancy - Usual prophylactic migraine treatments are not safe to take in pregnancy - Magnesium oxide in doses up to '400mg'$  per day is often considered first line in pregnancy as a  migraine preventative (level B evidence of efficacy) and is thought to be safe. IV Magnesium has been linked to bone demineralization in the fetus - Cyproheptadine (brand name Periactin) is an older anti-histamine that can be used (max dose '8mg'$ /day) in prophylaxis and has not been shown to have adverse effects to the fetus. Side effects include fatigue and weight gain. This should not be used while breastfeeding.  - Memantine (brand name Namenda) is one of the few preventives not found to cause harm in developing fetuses. It is a drug typically used to treat dementia, but it has also been found to be helpful in some studies of migraine prevention. It works by blocking glutamate, a chemical associated with increased migraine pain and frequency.  - Labetalol and Verapamil are blood pressure medications which may reduce frequency of migraine in pregnancy. These medications cross the placenta and the baby should be monitored for low heart rate and respiratory depression.  Non-pharmacological Methods of Treatment - these are the safest interventions and can significantly redcue the frequency and severity of migraine - Nerve blocks (lidocaine only unless after 20 weeks) - Physical Therapy - Acupuncture - Behavioral Therapies  - Relaxation strategies, stress management, biofeedback, yoga  - Identification of triggers: The major migraine triggers include stress, hormones, diet (missed meals more than specific food triggers), weather, sleep changes, odors, neck pain, lighting/glare, neck pain, exercise/exertion, caffeine intake and dehydration. - Lifestyle changes: eating regular healthy meals, adequate sleep, staying well-hydrated with water (64 oz/day), and at least 30 min of exercise (walking is fine) per day

## 2021-12-12 DIAGNOSIS — F331 Major depressive disorder, recurrent, moderate: Secondary | ICD-10-CM | POA: Diagnosis not present

## 2021-12-23 ENCOUNTER — Encounter (HOSPITAL_COMMUNITY): Payer: Self-pay

## 2021-12-23 ENCOUNTER — Telehealth (HOSPITAL_COMMUNITY): Payer: Self-pay | Admitting: *Deleted

## 2021-12-23 NOTE — Telephone Encounter (Signed)
Preadmission screen  

## 2021-12-23 NOTE — Patient Instructions (Signed)
Mackenzie Reeves  12/23/2021   Your procedure is scheduled on:  01/06/2022  Arrive at 0800 at Entrance C on [address] at [facility]  and [employer]. You are invited to use the FREE valet parking or use the Visitor's parking deck.  Pick up the phone at the desk and dial [phone].  Call this number if you have problems the morning of surgery: [phone]  Remember:   Do not eat food:(After Midnight) Desps de medianoche.  Do not drink clear liquids: (After Midnight) Desps de medianoche.  Take these medicines the morning of surgery with A SIP OF WATER:  none   Do not wear jewelry, make-up or nail polish.  Do not wear lotions, powders, or perfumes. Do not wear deodorant.  Do not shave 48 hours prior to surgery.  Do not bring valuables to the hospital.  [facility] is not   responsible for any belongings or valuables brought to the hospital.  Contacts, dentures or bridgework may not be worn into surgery.  Leave suitcase in the car. After surgery it may be brought to your room.  For patients admitted to the hospital, checkout time is 11:00 AM the day of              discharge.      Please read over the following fact sheets that you were given:     Preparing for Surgery

## 2021-12-24 ENCOUNTER — Encounter (HOSPITAL_COMMUNITY): Payer: Self-pay

## 2021-12-24 ENCOUNTER — Encounter (HOSPITAL_COMMUNITY): Payer: Self-pay | Admitting: Obstetrics and Gynecology

## 2021-12-24 DIAGNOSIS — Z3685 Encounter for antenatal screening for Streptococcus B: Secondary | ICD-10-CM | POA: Diagnosis not present

## 2021-12-24 LAB — OB RESULTS CONSOLE GBS: GBS: NEGATIVE

## 2021-12-29 NOTE — H&P (Signed)
Mackenzie Reeves is a 36 y.o.G2P1001 female presenting at [redacted] weeks gestation for scheduled repeat cesarean section due to history of previous cesarean section and myomectomy.  Her pregnancy is dated by 12week US' \\and'$  complicated by : History of myomectomy in [place]  History of cesarean section following myomectomy in [place]  Migraines - intractible. Saw two neurologists in pregnancy. Nl EEG; no seizure disorder. Pt chose to continue on keppra 500qd Uterine fibroids - 2.3cm right lateral intramural and 3.6cm post subserosal. Rh negative  Hemoglobin AC - plan to see hematology post delivery AMA - baby asa    She is GBS negative.  MaterniT expected range   OB History     Gravida  2   Para  1   Term  1   Preterm      AB      Living  1      SAB      IAB      Ectopic      Multiple      Live Births  1          Past Medical History:  Diagnosis Date   Fibroid    Headache    Past Surgical History:  Procedure Laterality Date   CESAREAN SECTION     MYOMECTOMY     Family History: family history includes Headache in her mother. Social History:  reports that she has never smoked. She has never been exposed to tobacco smoke. She has never used smokeless tobacco. She reports that she does not drink alcohol and does not use drugs.     Maternal Diabetes: No Genetic Screening: Normal Maternal Ultrasounds/Referrals: Normal Fetal Ultrasounds or other Referrals:  None Maternal Substance Abuse:  No Significant Maternal Medications:  Meds include: Other: keppra  Significant Maternal Lab Results:  Group B Strep negative Number of Prenatal Visits:greater than 3 verified prenatal visits Other Comments:  None  Review of Systems  Constitutional:  Positive for fatigue. Negative for activity change.  Eyes:  Positive for photophobia. Negative for visual disturbance.  Respiratory:  Negative for chest tightness and shortness of breath.   Cardiovascular:  Positive for leg swelling.  Negative for chest pain and palpitations.  Gastrointestinal:  Negative for abdominal pain.  Genitourinary:  Positive for pelvic pain. Negative for vaginal bleeding.  Musculoskeletal:  Positive for myalgias.  Neurological:  Positive for headaches. Negative for seizures.  Psychiatric/Behavioral:  The patient is nervous/anxious.    Maternal Medical History:  Reason for admission: Scheduled elective repeat cesarean section   Contractions: Frequency: rare.   Perceived severity is mild.   Fetal activity: Perceived fetal activity is normal.   Prenatal complications: Intractible migraines, fibroids, hx myomectomy and c/s , rh Neg  Prenatal Complications - Diabetes: none.     Height '5\' 10"'$  (1.778 m), weight 85.3 kg, last menstrual period 04/16/2021. Maternal Exam:  Uterine Assessment: Contraction strength is mild.  Contraction frequency is rare.  Abdomen: Patient reports generalized tenderness.  Surgical scars: low transverse.   Estimated fetal weight is AGA.   Fetal presentation: vertex Introitus: Normal vulva. Vulva is negative for condylomata and lesion.  Normal vagina.  Vagina is negative for condylomata.  Pelvis: of concern for delivery.   Cervix: Cervix evaluated by digital exam.     Fetal Exam Fetal Monitor Review: Baseline rate: 140.  Variability: moderate (6-25 bpm).   Pattern: accelerations present and no decelerations.   Fetal State Assessment: Category I - tracings are normal.   Physical Exam Constitutional:  Appearance: Normal appearance. She is normal weight.  Cardiovascular:     Rate and Rhythm: Normal rate.  Pulmonary:     Effort: Pulmonary effort is normal.  Abdominal:     Tenderness: There is generalized abdominal tenderness.  Genitourinary:    General: Normal vulva.  Vulva is no lesion.  Musculoskeletal:        General: Normal range of motion.     Cervical back: Normal range of motion.  Skin:    General: Skin is warm and dry.     Capillary Refill:  Capillary refill takes 2 to 3 seconds.  Neurological:     General: No focal deficit present.     Mental Status: She is alert and oriented to person, place, and time. Mental status is at baseline.  Psychiatric:        Mood and Affect: Mood normal.        Behavior: Behavior normal.        Thought Content: Thought content normal.     Prenatal labs: ABO, Rh: O/Negative/-- (03/17 0000) Antibody: Negative (03/17 0000) Rubella: Immune (03/17 0000) RPR: Nonreactive (03/17 0000)  HBsAg: Negative (03/17 0000)  HIV: Non-reactive (03/17 0000)  GBS:     Assessment/Plan: 32GM [id number] female presenting at [redacted] wks gestation for scheduled repeat cesarean section due to history of myomectomy and c/s - Admit  - ERAS protocol  - Verify consent  - To OR when ready    Venetia Night Cadarius Nevares 12/29/2021, 11:17 AM

## 2022-01-05 ENCOUNTER — Inpatient Hospital Stay (HOSPITAL_COMMUNITY)
Admission: AD | Admit: 2022-01-05 | Discharge: 2022-01-05 | Disposition: A | Discharge status: Home / Self Care | Payer: Medicaid Other | Source: Home / Self Care | Attending: Obstetrics and Gynecology | Admitting: Obstetrics and Gynecology

## 2022-01-05 ENCOUNTER — Encounter (HOSPITAL_COMMUNITY): Payer: Self-pay | Admitting: Obstetrics and Gynecology

## 2022-01-05 DIAGNOSIS — Z98891 History of uterine scar from previous surgery: Secondary | ICD-10-CM | POA: Insufficient documentation

## 2022-01-05 DIAGNOSIS — Z3A36 36 weeks gestation of pregnancy: Secondary | ICD-10-CM | POA: Insufficient documentation

## 2022-01-05 LAB — CBC
HCT: 29.8 % — ABNORMAL LOW (ref 36.0–46.0)
Hemoglobin: 10.7 g/dL — ABNORMAL LOW (ref 12.0–15.0)
MCH: 29.2 pg (ref 26.0–34.0)
MCHC: 35.9 g/dL (ref 30.0–36.0)
MCV: 81.2 fL (ref 80.0–100.0)
Platelets: 171 10*3/uL (ref 150–400)
RBC: 3.67 MIL/uL — ABNORMAL LOW (ref 3.87–5.11)
RDW: 14.1 % (ref 11.5–15.5)
WBC: 8.3 10*3/uL (ref 4.0–10.5)
nRBC: 0 % (ref 0.0–0.2)

## 2022-01-05 LAB — TYPE AND SCREEN
ABO/RH(D): O NEG
Antibody Screen: POSITIVE

## 2022-01-05 LAB — RPR: RPR Ser Ql: NONREACTIVE

## 2022-01-05 NOTE — MAU Note (Signed)
Pt here for pre op labs. Labs drawn by phlebotomy. Instructed patient to leave blood bank bracelet on. Pre op instructions and CHG soap given to patient.

## 2022-01-06 ENCOUNTER — Encounter (HOSPITAL_COMMUNITY)
Admission: RE | Disposition: A | Discharge status: Home / Self Care | Payer: Self-pay | Source: Home / Self Care | Attending: Obstetrics and Gynecology

## 2022-01-06 ENCOUNTER — Other Ambulatory Visit: Payer: Self-pay

## 2022-01-06 ENCOUNTER — Encounter (HOSPITAL_COMMUNITY): Payer: Self-pay | Admitting: Obstetrics and Gynecology

## 2022-01-06 ENCOUNTER — Inpatient Hospital Stay (HOSPITAL_COMMUNITY): Payer: Medicaid Other | Admitting: Anesthesiology

## 2022-01-06 ENCOUNTER — Inpatient Hospital Stay (HOSPITAL_COMMUNITY)
Admission: RE | Admit: 2022-01-06 | Discharge: 2022-01-09 | DRG: 787 | Disposition: A | Discharge status: Home / Self Care | Payer: Medicaid Other | Attending: Obstetrics and Gynecology | Admitting: Obstetrics and Gynecology

## 2022-01-06 DIAGNOSIS — O99354 Diseases of the nervous system complicating childbirth: Secondary | ICD-10-CM | POA: Diagnosis present

## 2022-01-06 DIAGNOSIS — N736 Female pelvic peritoneal adhesions (postinfective): Secondary | ICD-10-CM | POA: Diagnosis not present

## 2022-01-06 DIAGNOSIS — O34219 Maternal care for unspecified type scar from previous cesarean delivery: Principal | ICD-10-CM | POA: Diagnosis present

## 2022-01-06 DIAGNOSIS — Z3A37 37 weeks gestation of pregnancy: Secondary | ICD-10-CM

## 2022-01-06 DIAGNOSIS — O3483 Maternal care for other abnormalities of pelvic organs, third trimester: Secondary | ICD-10-CM

## 2022-01-06 DIAGNOSIS — O9081 Anemia of the puerperium: Secondary | ICD-10-CM | POA: Diagnosis not present

## 2022-01-06 DIAGNOSIS — Z98891 History of uterine scar from previous surgery: Principal | ICD-10-CM

## 2022-01-06 DIAGNOSIS — O3413 Maternal care for benign tumor of corpus uteri, third trimester: Secondary | ICD-10-CM | POA: Diagnosis present

## 2022-01-06 DIAGNOSIS — O468X3 Other antepartum hemorrhage, third trimester: Secondary | ICD-10-CM | POA: Diagnosis not present

## 2022-01-06 DIAGNOSIS — D259 Leiomyoma of uterus, unspecified: Secondary | ICD-10-CM | POA: Diagnosis present

## 2022-01-06 DIAGNOSIS — D62 Acute posthemorrhagic anemia: Secondary | ICD-10-CM | POA: Diagnosis not present

## 2022-01-06 DIAGNOSIS — Z6791 Unspecified blood type, Rh negative: Secondary | ICD-10-CM | POA: Diagnosis not present

## 2022-01-06 DIAGNOSIS — O26893 Other specified pregnancy related conditions, third trimester: Secondary | ICD-10-CM | POA: Diagnosis present

## 2022-01-06 DIAGNOSIS — G43919 Migraine, unspecified, intractable, without status migrainosus: Secondary | ICD-10-CM | POA: Diagnosis not present

## 2022-01-06 HISTORY — DX: Benign neoplasm of connective and other soft tissue, unspecified: D21.9

## 2022-01-06 LAB — CBC WITH DIFFERENTIAL/PLATELET
Abs Immature Granulocytes: 0.16 10*3/uL — ABNORMAL HIGH (ref 0.00–0.07)
Basophils Absolute: 0 10*3/uL (ref 0.0–0.1)
Basophils Relative: 0 %
Eosinophils Absolute: 0 10*3/uL (ref 0.0–0.5)
Eosinophils Relative: 0 %
HCT: 25.7 % — ABNORMAL LOW (ref 36.0–46.0)
Hemoglobin: 9.4 g/dL — ABNORMAL LOW (ref 12.0–15.0)
Immature Granulocytes: 1 %
Lymphocytes Relative: 7 %
Lymphs Abs: 1.3 10*3/uL (ref 0.7–4.0)
MCH: 29.7 pg (ref 26.0–34.0)
MCHC: 36.6 g/dL — ABNORMAL HIGH (ref 30.0–36.0)
MCV: 81.3 fL (ref 80.0–100.0)
Monocytes Absolute: 0.4 10*3/uL (ref 0.1–1.0)
Monocytes Relative: 2 %
Neutro Abs: 15.6 10*3/uL — ABNORMAL HIGH (ref 1.7–7.7)
Neutrophils Relative %: 90 %
Platelets: 178 10*3/uL (ref 150–400)
RBC: 3.16 MIL/uL — ABNORMAL LOW (ref 3.87–5.11)
RDW: 13.8 % (ref 11.5–15.5)
WBC: 17.5 10*3/uL — ABNORMAL HIGH (ref 4.0–10.5)
nRBC: 0 % (ref 0.0–0.2)

## 2022-01-06 LAB — CBC
HCT: 29.4 % — ABNORMAL LOW (ref 36.0–46.0)
Hemoglobin: 10.8 g/dL — ABNORMAL LOW (ref 12.0–15.0)
MCH: 29.7 pg (ref 26.0–34.0)
MCHC: 36.7 g/dL — ABNORMAL HIGH (ref 30.0–36.0)
MCV: 80.8 fL (ref 80.0–100.0)
Platelets: 183 10*3/uL (ref 150–400)
RBC: 3.64 MIL/uL — ABNORMAL LOW (ref 3.87–5.11)
RDW: 14 % (ref 11.5–15.5)
WBC: 10.6 10*3/uL — ABNORMAL HIGH (ref 4.0–10.5)
nRBC: 0 % (ref 0.0–0.2)

## 2022-01-06 LAB — DIC (DISSEMINATED INTRAVASCULAR COAGULATION)PANEL
D-Dimer, Quant: 7.36 ug/mL-FEU — ABNORMAL HIGH (ref 0.00–0.50)
Fibrinogen: 384 mg/dL (ref 210–475)
INR: 1.1 (ref 0.8–1.2)
Platelets: 205 10*3/uL (ref 150–400)
Prothrombin Time: 14.2 seconds (ref 11.4–15.2)
Smear Review: NONE SEEN
aPTT: 24 seconds (ref 24–36)

## 2022-01-06 LAB — ABO/RH: ABO/RH(D): O NEG

## 2022-01-06 SURGERY — Surgical Case
Anesthesia: Spinal

## 2022-01-06 MED ORDER — DIPHENHYDRAMINE HCL 25 MG PO CAPS
25.0000 mg | ORAL_CAPSULE | Freq: Four times a day (QID) | ORAL | Status: DC | PRN
  Administered 2022-01-06: 25 mg via ORAL

## 2022-01-06 MED ORDER — ACETAMINOPHEN 10 MG/ML IV SOLN: 1000.0000 mg | Freq: Once | INTRAVENOUS | Status: DC | PRN

## 2022-01-06 MED ORDER — TRANEXAMIC ACID-NACL 1000-0.7 MG/100ML-% IV SOLN
INTRAVENOUS | Status: DC | PRN
  Administered 2022-01-06: 1000 mg via INTRAVENOUS

## 2022-01-06 MED ORDER — ACETAMINOPHEN 500 MG PO TABS: 1000.0000 mg | ORAL_TABLET | ORAL | Status: DC

## 2022-01-06 MED ORDER — ACETAMINOPHEN 500 MG PO TABS
ORAL_TABLET | ORAL | Status: AC
  Filled 2022-01-06: qty 2

## 2022-01-06 MED ORDER — MENTHOL 3 MG MT LOZG
1.0000 | LOZENGE | OROMUCOSAL | Status: DC | PRN
Start: 2022-01-06 — End: 2022-01-09

## 2022-01-06 MED ORDER — OXYCODONE HCL 5 MG PO TABS
5.0000 mg | ORAL_TABLET | ORAL | Status: DC | PRN
  Administered 2022-01-07 – 2022-01-08 (×7): 5 mg via ORAL
  Administered 2022-01-09 (×3): 10 mg via ORAL
  Filled 2022-01-06 (×2): qty 2
  Filled 2022-01-06 (×2): qty 1
  Filled 2022-01-06: qty 2
  Filled 2022-01-06 (×5): qty 1

## 2022-01-06 MED ORDER — ZOLPIDEM TARTRATE 5 MG PO TABS
5.0000 mg | ORAL_TABLET | Freq: Every evening | ORAL | Status: DC | PRN
  Administered 2022-01-07: 5 mg via ORAL
  Filled 2022-01-06: qty 1

## 2022-01-06 MED ORDER — COCONUT OIL OIL: 1.0000 | TOPICAL_OIL | Status: DC | PRN

## 2022-01-06 MED ORDER — PRENATAL MULTIVITAMIN CH
1.0000 | ORAL_TABLET | Freq: Every day | ORAL | Status: DC
  Administered 2022-01-06 – 2022-01-08 (×3): 1 via ORAL
  Filled 2022-01-06 (×3): qty 1

## 2022-01-06 MED ORDER — ONDANSETRON HCL 4 MG/2ML IJ SOLN
INTRAMUSCULAR | Status: AC
  Filled 2022-01-06: qty 2

## 2022-01-06 MED ORDER — TRAMADOL HCL 50 MG PO TABS
50.0000 mg | ORAL_TABLET | Freq: Four times a day (QID) | ORAL | Status: DC | PRN
  Administered 2022-01-07 (×2): 50 mg via ORAL
  Filled 2022-01-06 (×4): qty 1

## 2022-01-06 MED ORDER — SIMETHICONE 80 MG PO CHEW: 80.0000 mg | CHEWABLE_TABLET | ORAL | Status: DC | PRN

## 2022-01-06 MED ORDER — PHENYLEPHRINE 80 MCG/ML (10ML) SYRINGE FOR IV PUSH (FOR BLOOD PRESSURE SUPPORT)
PREFILLED_SYRINGE | INTRAVENOUS | Status: DC | PRN
  Administered 2022-01-06: 80 ug via INTRAVENOUS

## 2022-01-06 MED ORDER — FENTANYL CITRATE (PF) 100 MCG/2ML IJ SOLN
INTRAMUSCULAR | Status: DC | PRN
  Administered 2022-01-06: 15 ug via INTRATHECAL

## 2022-01-06 MED ORDER — DEXAMETHASONE SODIUM PHOSPHATE 10 MG/ML IJ SOLN
INTRAMUSCULAR | Status: AC
  Filled 2022-01-06: qty 1

## 2022-01-06 MED ORDER — BUPIVACAINE IN DEXTROSE 0.75-8.25 % IT SOLN
INTRATHECAL | Status: DC | PRN
  Administered 2022-01-06: 1.4 mL via INTRATHECAL

## 2022-01-06 MED ORDER — SIMETHICONE 80 MG PO CHEW
80.0000 mg | CHEWABLE_TABLET | Freq: Three times a day (TID) | ORAL | Status: DC
  Administered 2022-01-06 – 2022-01-09 (×8): 80 mg via ORAL
  Filled 2022-01-06 (×7): qty 1

## 2022-01-06 MED ORDER — FENTANYL CITRATE (PF) 100 MCG/2ML IJ SOLN
INTRAMUSCULAR | Status: AC
  Filled 2022-01-06: qty 2

## 2022-01-06 MED ORDER — OXYTOCIN-SODIUM CHLORIDE 30-0.9 UT/500ML-% IV SOLN
INTRAVENOUS | Status: AC
  Filled 2022-01-06: qty 500

## 2022-01-06 MED ORDER — FERROUS SULFATE 325 (65 FE) MG PO TABS
325.0000 mg | ORAL_TABLET | Freq: Two times a day (BID) | ORAL | Status: DC
  Administered 2022-01-06 – 2022-01-09 (×6): 325 mg via ORAL
  Filled 2022-01-06 (×6): qty 1

## 2022-01-06 MED ORDER — DIBUCAINE (PERIANAL) 1 % EX OINT: 1.0000 | TOPICAL_OINTMENT | CUTANEOUS | Status: DC | PRN

## 2022-01-06 MED ORDER — PHENYLEPHRINE HCL-NACL 20-0.9 MG/250ML-% IV SOLN
INTRAVENOUS | Status: AC
  Filled 2022-01-06: qty 250

## 2022-01-06 MED ORDER — ONDANSETRON HCL 4 MG/2ML IJ SOLN
INTRAMUSCULAR | Status: DC | PRN
  Administered 2022-01-06: 4 mg via INTRAVENOUS

## 2022-01-06 MED ORDER — MORPHINE SULFATE (PF) 0.5 MG/ML IJ SOLN
INTRAMUSCULAR | Status: AC
  Filled 2022-01-06: qty 10

## 2022-01-06 MED ORDER — SOD CITRATE-CITRIC ACID 500-334 MG/5ML PO SOLN
ORAL | Status: AC
  Filled 2022-01-06: qty 30

## 2022-01-06 MED ORDER — MEPERIDINE HCL 25 MG/ML IJ SOLN: 6.2500 mg | INTRAMUSCULAR | Status: DC | PRN

## 2022-01-06 MED ORDER — CEFAZOLIN SODIUM-DEXTROSE 2-4 GM/100ML-% IV SOLN
INTRAVENOUS | Status: AC
  Filled 2022-01-06: qty 100

## 2022-01-06 MED ORDER — PROMETHAZINE HCL 25 MG/ML IJ SOLN: 6.2500 mg | INTRAMUSCULAR | Status: DC | PRN

## 2022-01-06 MED ORDER — PHENYLEPHRINE 80 MCG/ML (10ML) SYRINGE FOR IV PUSH (FOR BLOOD PRESSURE SUPPORT)
PREFILLED_SYRINGE | INTRAVENOUS | Status: AC
  Filled 2022-01-06: qty 10

## 2022-01-06 MED ORDER — DEXAMETHASONE SODIUM PHOSPHATE 10 MG/ML IJ SOLN
INTRAMUSCULAR | Status: DC | PRN
  Administered 2022-01-06: 10 mg via INTRAVENOUS

## 2022-01-06 MED ORDER — DIPHENHYDRAMINE HCL 50 MG/ML IJ SOLN
INTRAMUSCULAR | Status: AC
  Filled 2022-01-06: qty 1

## 2022-01-06 MED ORDER — CEFAZOLIN SODIUM-DEXTROSE 2-4 GM/100ML-% IV SOLN
2.0000 g | INTRAVENOUS | Status: AC
  Administered 2022-01-06: 2 g via INTRAVENOUS

## 2022-01-06 MED ORDER — OXYTOCIN-SODIUM CHLORIDE 30-0.9 UT/500ML-% IV SOLN: 2.5000 [IU]/h | INTRAVENOUS | Status: AC

## 2022-01-06 MED ORDER — ACETAMINOPHEN 10 MG/ML IV SOLN
1000.0000 mg | Freq: Once | INTRAVENOUS | Status: AC
  Administered 2022-01-06: 1000 mg via INTRAVENOUS

## 2022-01-06 MED ORDER — POVIDONE-IODINE 10 % EX SWAB
2.0000 | Freq: Once | CUTANEOUS | Status: AC
  Administered 2022-01-06: 2 via TOPICAL

## 2022-01-06 MED ORDER — HYDROMORPHONE HCL 1 MG/ML IJ SOLN
INTRAMUSCULAR | Status: AC
  Filled 2022-01-06: qty 0.5

## 2022-01-06 MED ORDER — PHENYLEPHRINE HCL-NACL 20-0.9 MG/250ML-% IV SOLN
INTRAVENOUS | Status: DC | PRN
  Administered 2022-01-06: 60 ug/min via INTRAVENOUS

## 2022-01-06 MED ORDER — DEXMEDETOMIDINE (PRECEDEX) IN NS 20 MCG/5ML (4 MCG/ML) IV SYRINGE
PREFILLED_SYRINGE | INTRAVENOUS | Status: DC | PRN
  Administered 2022-01-06: 8 ug via INTRAVENOUS

## 2022-01-06 MED ORDER — HYDROMORPHONE HCL 1 MG/ML IJ SOLN
0.2500 mg | INTRAMUSCULAR | Status: DC | PRN
  Administered 2022-01-06 (×2): 0.5 mg via INTRAVENOUS

## 2022-01-06 MED ORDER — OXYTOCIN-SODIUM CHLORIDE 30-0.9 UT/500ML-% IV SOLN
INTRAVENOUS | Status: DC | PRN
  Administered 2022-01-06: 30 [IU] via INTRAVENOUS

## 2022-01-06 MED ORDER — WITCH HAZEL-GLYCERIN EX PADS: 1.0000 | MEDICATED_PAD | CUTANEOUS | Status: DC | PRN

## 2022-01-06 MED ORDER — ACETAMINOPHEN 10 MG/ML IV SOLN
INTRAVENOUS | Status: AC
  Filled 2022-01-06: qty 100

## 2022-01-06 MED ORDER — MORPHINE SULFATE (PF) 0.5 MG/ML IJ SOLN
INTRAMUSCULAR | Status: DC | PRN
  Administered 2022-01-06: 150 ug via INTRATHECAL

## 2022-01-06 MED ORDER — TRANEXAMIC ACID-NACL 1000-0.7 MG/100ML-% IV SOLN
INTRAVENOUS | Status: AC
  Filled 2022-01-06: qty 100

## 2022-01-06 MED ORDER — DIPHENHYDRAMINE HCL 50 MG/ML IJ SOLN
12.5000 mg | INTRAMUSCULAR | Status: DC | PRN
  Administered 2022-01-06: 12.5 mg via INTRAVENOUS

## 2022-01-06 MED ORDER — RHO D IMMUNE GLOBULIN 1500 UNIT/2ML IJ SOSY
300.0000 ug | PREFILLED_SYRINGE | Freq: Once | INTRAMUSCULAR | Status: AC
  Administered 2022-01-07: 300 ug via INTRAVENOUS
  Filled 2022-01-06: qty 2

## 2022-01-06 MED ORDER — GABAPENTIN 100 MG PO CAPS
100.0000 mg | ORAL_CAPSULE | Freq: Two times a day (BID) | ORAL | Status: DC
  Administered 2022-01-06 – 2022-01-09 (×7): 100 mg via ORAL
  Filled 2022-01-06 (×7): qty 1

## 2022-01-06 MED ORDER — STERILE WATER FOR IRRIGATION IR SOLN
Status: DC | PRN
  Administered 2022-01-06: 1000 mL

## 2022-01-06 MED ORDER — TETANUS-DIPHTH-ACELL PERTUSSIS 5-2.5-18.5 LF-MCG/0.5 IM SUSY: 0.5000 mL | PREFILLED_SYRINGE | Freq: Once | INTRAMUSCULAR | Status: DC

## 2022-01-06 MED ORDER — SOD CITRATE-CITRIC ACID 500-334 MG/5ML PO SOLN
30.0000 mL | ORAL | Status: AC
  Administered 2022-01-06: 30 mL via ORAL

## 2022-01-06 MED ORDER — SENNOSIDES-DOCUSATE SODIUM 8.6-50 MG PO TABS
2.0000 | ORAL_TABLET | Freq: Every day | ORAL | Status: DC
  Administered 2022-01-07 – 2022-01-09 (×3): 2 via ORAL
  Filled 2022-01-06 (×3): qty 2

## 2022-01-06 MED ORDER — DIPHENHYDRAMINE HCL 25 MG PO CAPS
25.0000 mg | ORAL_CAPSULE | ORAL | Status: DC | PRN
  Filled 2022-01-06: qty 1

## 2022-01-06 MED ORDER — LACTATED RINGERS IV SOLN: INTRAVENOUS | Status: DC

## 2022-01-06 SURGICAL SUPPLY — 42 items
BENZOIN TINCTURE PRP APPL 2/3 (GAUZE/BANDAGES/DRESSINGS) IMPLANT
CHLORAPREP W/TINT 26ML (MISCELLANEOUS) ×2 IMPLANT
CLAMP CORD UMBIL (MISCELLANEOUS) ×1 IMPLANT
CLOTH BEACON ORANGE TIMEOUT ST (SAFETY) ×1 IMPLANT
DRAPE C SECTION CLR SCREEN (DRAPES) ×1 IMPLANT
DRSG OPSITE POSTOP 4X10 (GAUZE/BANDAGES/DRESSINGS) ×1 IMPLANT
ELECT REM PT RETURN 9FT ADLT (ELECTROSURGICAL) ×1
ELECTRODE REM PT RTRN 9FT ADLT (ELECTROSURGICAL) ×1 IMPLANT
EXTRACTOR VACUUM KIWI (MISCELLANEOUS) IMPLANT
GAUZE SPONGE 4X4 12PLY STRL LF (GAUZE/BANDAGES/DRESSINGS) IMPLANT
GLOVE BIO SURGEON STRL SZ 6.5 (GLOVE) ×1 IMPLANT
GLOVE BIOGEL PI IND STRL 7.0 (GLOVE) ×2 IMPLANT
GLOVE BIOGEL PI INDICATOR 7.0 (GLOVE) ×2
GOWN STRL REUS W/TWL LRG LVL3 (GOWN DISPOSABLE) ×2 IMPLANT
HEMOSTAT ARISTA ABSORB 3G PWDR (HEMOSTASIS) IMPLANT
KIT ABG SYR 3ML LUER SLIP (SYRINGE) IMPLANT
NDL HYPO 25X5/8 SAFETYGLIDE (NEEDLE) IMPLANT
NEEDLE HYPO 25X5/8 SAFETYGLIDE (NEEDLE) IMPLANT
NS IRRIG 1000ML POUR BTL (IV SOLUTION) ×1 IMPLANT
PACK C SECTION WH (CUSTOM PROCEDURE TRAY) ×1 IMPLANT
PAD ABD 7.5X8 STRL (GAUZE/BANDAGES/DRESSINGS) IMPLANT
PAD OB MATERNITY 4.3X12.25 (PERSONAL CARE ITEMS) ×1 IMPLANT
RETRACTOR WND ALEXIS 25 LRG (MISCELLANEOUS) ×1 IMPLANT
RTRCTR C-SECT PINK 25CM LRG (MISCELLANEOUS) IMPLANT
RTRCTR WOUND ALEXIS 25CM LRG (MISCELLANEOUS) ×1
SPONGE SURGIFOAM ABS GEL 12-7 (HEMOSTASIS) IMPLANT
STRIP CLOSURE SKIN 1/2X4 (GAUZE/BANDAGES/DRESSINGS) IMPLANT
SUT CHROMIC 1 CTX 36 (SUTURE) ×2 IMPLANT
SUT PLAIN 0 NONE (SUTURE) IMPLANT
SUT PLAIN 2 0 XLH (SUTURE) ×1 IMPLANT
SUT VIC AB 0 CT1 27 (SUTURE) ×2
SUT VIC AB 0 CT1 27XBRD ANBCTR (SUTURE) ×2 IMPLANT
SUT VIC AB 0 CT1 36 (SUTURE) IMPLANT
SUT VIC AB 2-0 CT1 (SUTURE) IMPLANT
SUT VIC AB 2-0 CT1 27 (SUTURE) ×3
SUT VIC AB 2-0 CT1 TAPERPNT 27 (SUTURE) ×1 IMPLANT
SUT VIC AB 3-0 CT1 27 (SUTURE)
SUT VIC AB 3-0 CT1 TAPERPNT 27 (SUTURE) IMPLANT
SUT VIC AB 4-0 KS 27 (SUTURE) ×1 IMPLANT
TOWEL OR 17X24 6PK STRL BLUE (TOWEL DISPOSABLE) ×1 IMPLANT
TRAY FOLEY W/BAG SLVR 14FR LF (SET/KITS/TRAYS/PACK) ×1 IMPLANT
WATER STERILE IRR 1000ML POUR (IV SOLUTION) ×1 IMPLANT

## 2022-01-06 NOTE — Anesthesia Procedure Notes (Signed)
Spinal  Patient location during procedure: OR Start time: 01/06/2022 10:08 AM End time: 01/06/2022 10:13 AM Reason for block: surgical anesthesia Staffing Performed: anesthesiologist  Anesthesiologist: Lyn Hollingshead, MD Performed by: Lyn Hollingshead, MD Authorized by: Lyn Hollingshead, MD   Preanesthetic Checklist Completed: patient identified, IV checked, site marked, risks and benefits discussed, surgical consent, monitors and equipment checked, pre-op evaluation and timeout performed Spinal Block Patient position: sitting Prep: DuraPrep and site prepped and draped Patient monitoring: continuous pulse ox and blood pressure Approach: midline Location: L3-4 Injection technique: single-shot Needle Needle type: Pencan  Needle gauge: 24 G Needle length: 10 cm Needle insertion depth: 7 cm Assessment Sensory level: T6 Events: CSF return

## 2022-01-06 NOTE — Lactation Note (Signed)
This note was copied from a baby's chart. Lactation Consultation Note  Patient Name: Mackenzie Reeves [id number] Date: 01/06/2022 Reason for consult: Initial assessment;Early term 37-38.6wks Age:37 hours P2, ETI female infant. Birth Parent is experienced in BF, she BF 1st child for 18 months, 1st child is currently 4 years old. Birth Parent has DEBP at home. Birth Parent latched infant on her right breast using the football hold position, infant BF for 13 minutes. Infant was having low temps after latching infant at the breast Birth Parent was doing STS with infant on her chest. Birth Parent will continue to BF infant according to hunger cues, on demand, 8 to 12+ times within 24 hrs, STS. Birth Parent knows to ask RN/LC for latch assistance if needed. LC discussed importance of maternal rest, diet and hydration. Birth Parent made aware of O/P services, breastfeeding support groups, community resources, and our phone # for post-discharge questions.   Maternal Data    Feeding Mother's Current Feeding Choice: Breast Milk  LATCH Score Latch: Grasps breast easily, tongue down, lips flanged, rhythmical sucking.  Audible Swallowing: A few with stimulation  Type of Nipple: Everted at rest and after stimulation  Comfort (Breast/Nipple): Soft / non-tender  Hold (Positioning): Assistance needed to correctly position infant at breast and maintain latch.  LATCH Score: 8   Lactation Tools Discussed/Used    Interventions Interventions: Breast feeding basics reviewed;Skin to skin;Assisted with latch;Breast compression;Adjust position;Support pillows;Position options;Education;LC Services brochure  Discharge Pump: Personal (Per Birth Parent, she has Medela DEBP at home.)  Consult Status Consult Status: Follow-up Date: 01/07/22 Follow-up type: In-patient    Vicente Serene 01/06/2022, 3:28 PM

## 2022-01-06 NOTE — Anesthesia Preprocedure Evaluation (Signed)
Anesthesia Evaluation  Patient identified by MRN, date of birth, ID band Patient awake    Reviewed: Allergy & Precautions, H&P , NPO status , Patient's Chart, lab work & pertinent test results  Airway Mallampati: II  TM Distance: >3 FB     Dental no notable dental hx.    Pulmonary neg pulmonary ROS,    Pulmonary exam normal        Cardiovascular negative cardio ROS Normal cardiovascular exam Rate:Normal     Neuro/Psych  Headaches, negative psych ROS   GI/Hepatic negative GI ROS, Neg liver ROS,   Endo/Other  negative endocrine ROS  Renal/GU negative Renal ROS     Musculoskeletal   Abdominal   Peds  Hematology negative hematology ROS (+)   Anesthesia Other Findings   Reproductive/Obstetrics (+) Pregnancy                             Anesthesia Physical Anesthesia Plan  ASA: 2  Anesthesia Plan: Spinal   Post-op Pain Management:    Induction:   PONV Risk Score and Plan: 3 and Ondansetron, Scopolamine patch - Pre-op, Dexamethasone and Treatment may vary due to age or medical condition  Airway Management Planned: Natural Airway, Simple Face Mask and Nasal Cannula  Additional Equipment:   Intra-op Plan:   Post-operative Plan:   Informed Consent: I have reviewed the patients History and Physical, chart, labs and discussed the procedure including the risks, benefits and alternatives for the proposed anesthesia with the patient or authorized representative who has indicated his/her understanding and acceptance.       Plan Discussed with: CRNA  Anesthesia Plan Comments:         Anesthesia Quick Evaluation

## 2022-01-06 NOTE — Transfer of Care (Signed)
Immediate Anesthesia Transfer of Care Note  Patient: Mackenzie Reeves  Procedure(s) Performed: CESAREAN SECTION  Patient Location: PACU  Anesthesia Type:Spinal  Level of Consciousness: awake, alert , oriented and patient cooperative  Airway & Oxygen Therapy: Patient Spontanous Breathing  Post-op Assessment: Report given to RN and Post -op Vital signs reviewed and stable  Post vital signs: Reviewed and stable  Last Vitals:  Vitals Value Taken Time  BP 107/62 01/06/22 1205  Temp    Pulse 89 01/06/22 1209  Resp 14 01/06/22 1209  SpO2 100 % 01/06/22 1209  Vitals shown include unvalidated device data.  Last Pain:  Vitals:   01/06/22 0832  TempSrc: Oral         Complications: No notable events documented.

## 2022-01-06 NOTE — Op Note (Addendum)
Operative Note    Preoperative Diagnosis IUP at 37 0/7wks  Previous cesarean section  Previous myomectomy    Postoperative Diagnosis Same  4. Significant intrauterine adhesions of uterus to anterior abdominal wall  5, Intraoperative hemorrhage EBL 1241m   Procedure:: Repeat cesarean section with single layered closure and reapproximation of pelvic anatomy    Surgeon: BMickle MalloryDO Assist: Meisinger, T MD   Anesthesia: Spinal   Fluids: 2L EBL 12551mUOP: 10078m Findings:  Viable female infant in vertex position with a loose nuchal x 1. APgars 8,9, Weight pending   Uterus adhered to anterior abdominal with obliterated borders. Unable to access ovaries and tubes to  evaluate.   '1000mg'$  tranexamic acid administered intraoperatively   Specimen: Placenta to L/D   Procedure Note Consent verified pre-op. All questions answered   Patient was taken to the operating room where spinal anesthesia was administered. She was prepped and draped in the normal sterile fashion and placed in the dorsal supine position with a leftward tilt. An appropriate time out was performed.  Once anesthesia was confirmed to be adequate, a Pfannenstiel skin incision was then made through the previous incision with the scalpel and carried through to the underlying layer of fascia by sharp dissection and Bovie cautery.Oozing was noted and cauterized to effect hemostasis.  Next, the fascia was nicked in the midline and the incision was extended laterally with Mayo scissors. The superior and inferior aspects of the incision were grasped with kocher clamps and dissected off the underlying rectus muscles. The rectus muscles were noted to be scarred and thus separated gradually using hemostats and sharp dissection. There was no separation of the rectus with the uterine musculature however thus gradual dissection suddenly revealed the amniotic sac bulging through.  An alexis was placed to help with visualization  and the sac ruptured. Clear amniotic fluid was noted.  The infant was delivered vertex through a loose nuchal that was delivered over infants head. Anterior and posterior shoulders easily delivered next. Vigorous spontaneous cry was noted during delivery. Bulb suction was performed and due to mild tone only, the cord was clamped and cut before a minute was up. Tone improved quickly.  The placenta was manually extracted and the uterus cleared of all clots and debris with moist lap sponge.  The uterus was separated enough from the anterior abdominal wall to effect closure of the uterus in a single layer. The lower uterine segment was too thin to effect an imbrication. There was moderate bleeding in several areas of the muscle layer that required suture ligation. A combination of 2-0 and 0 vicryl were used. Arista x 3 was also used intermittently during the repair. Bleeding stabilized enough for closure  All instruments and sponges were then removed from the abdomen.  The fascia was then closed with 0 Vicryl in a running fashion. The skin was closed with a subcuticular stitch of 4-0 Vicryl on a Keith needle and then reinforced with benzoin and Steri-Strips and a pressure dressing.  At the conclusion of the procedure all instruments and sponge counts were correct. Patient was taken to the recovery room in good condition with her baby accompanying her skin to skin.   Stat CBC done during the delivery noted Hg of 10.8 ( started at 10.7) and platelets of 183 (started at 171)   Coags pending

## 2022-01-06 NOTE — Anesthesia Postprocedure Evaluation (Signed)
Anesthesia Post Note  Patient: Mackenzie Reeves  Procedure(s) Performed: CESAREAN SECTION     Patient location during evaluation: PACU Anesthesia Type: Spinal Level of consciousness: awake and sedated Pain management: pain level controlled Vital Signs Assessment: post-procedure vital signs reviewed and stable Respiratory status: spontaneous breathing Cardiovascular status: stable Postop Assessment: no headache, no backache, spinal receding, patient able to bend at knees and no apparent nausea or vomiting Anesthetic complications: no   No notable events documented.  Last Vitals:  Vitals:   01/06/22 1300 01/06/22 1315  BP: 110/63   Pulse: 76 83  Resp: 14 18  Temp:    SpO2: 100% 99%    Last Pain:  Vitals:   01/06/22 1300  TempSrc:   PainSc: 3    Pain Goal: Patients Stated Pain Goal: 7 (01/06/22 1205)  LLE Motor Response: Non-purposeful movement (01/06/22 1300) LLE Sensation: Tingling (01/06/22 1300) RLE Motor Response: Non-purposeful movement (01/06/22 1300) RLE Sensation: Tingling (01/06/22 1300) L Sensory Level: L5-Outer lower leg, top of foot, great toe (01/06/22 1300) R Sensory Level: L5-Outer lower leg, top of foot, great toe (01/06/22 1300)    Huston Foley

## 2022-01-06 NOTE — Interval H&P Note (Signed)
History and Physical Interval Note:  Pt seen. She reports no complaints. No change since H/P done Consent for surgery confirmed To OR when ready   01/06/2022 9:53 AM  Mackenzie Reeves  has presented today for surgery, with the diagnosis of history of myomectomy and previous cesarean section.  The various methods of treatment have been discussed with the patient and family. After consideration of risks, benefits and other options for treatment, the patient has consented to  Procedure(s): CESAREAN SECTION (N/A) as a surgical intervention.  The patient's history has been reviewed, patient examined, no change in status, stable for surgery.  I have reviewed the patient's chart and labs.  Questions were answered to the patient's satisfaction.     Isaiah Serge

## 2022-01-07 LAB — CBC
HCT: 20.5 % — ABNORMAL LOW (ref 36.0–46.0)
Hemoglobin: 7.4 g/dL — ABNORMAL LOW (ref 12.0–15.0)
MCH: 29.2 pg (ref 26.0–34.0)
MCHC: 36.1 g/dL — ABNORMAL HIGH (ref 30.0–36.0)
MCV: 81 fL (ref 80.0–100.0)
Platelets: 149 10*3/uL — ABNORMAL LOW (ref 150–400)
RBC: 2.53 MIL/uL — ABNORMAL LOW (ref 3.87–5.11)
RDW: 13.9 % (ref 11.5–15.5)
WBC: 12.6 10*3/uL — ABNORMAL HIGH (ref 4.0–10.5)
nRBC: 0 % (ref 0.0–0.2)

## 2022-01-07 MED ORDER — ACETAMINOPHEN 500 MG PO TABS
1000.0000 mg | ORAL_TABLET | Freq: Four times a day (QID) | ORAL | Status: AC
  Administered 2022-01-07 – 2022-01-08 (×4): 1000 mg via ORAL
  Filled 2022-01-07 (×4): qty 2

## 2022-01-07 MED ORDER — ONDANSETRON HCL 4 MG/2ML IJ SOLN: 4.0000 mg | Freq: Three times a day (TID) | INTRAMUSCULAR | Status: DC | PRN

## 2022-01-07 MED ORDER — SCOPOLAMINE 1 MG/3DAYS TD PT72: 1.0000 | MEDICATED_PATCH | Freq: Once | TRANSDERMAL | Status: DC

## 2022-01-07 MED ORDER — NALOXONE HCL 0.4 MG/ML IJ SOLN: 0.4000 mg | INTRAMUSCULAR | Status: DC | PRN

## 2022-01-07 MED ORDER — SODIUM CHLORIDE 0.9% FLUSH
3.0000 mL | INTRAVENOUS | Status: DC | PRN
Start: 2022-01-07 — End: 2022-01-09

## 2022-01-07 MED ORDER — NALOXONE HCL 4 MG/10ML IJ SOLN: 1.0000 ug/kg/h | INTRAVENOUS | Status: DC | PRN

## 2022-01-07 NOTE — Progress Notes (Signed)
Patient ambulated to the bathroom at 1300 well with no dizziness. Maxwell Caul, Leretha Dykes [place]

## 2022-01-07 NOTE — Lactation Note (Signed)
This note was copied from a baby's chart. Lactation Consultation Note  Patient Name: Girl Medina Degraffenreid SHFWY'O Date: 01/07/2022 Reason for consult: Follow-up assessment;Early term 37-38.6wks Age:37 hours, P2, ETI female infant no weight loss. Infant is being BF and supplemented with formula Birth Parent's choice. Birth Parent latched infant on her left breast using the cross cradle hold, infant latched with depth and was still BF after 10 minutes when LC left the room. Birth Parent plans to supplement infant with formula after infant BF for 15 to 20 minutes at the breast with this feeding. Birth Parent was concern about milk supply , LC suggested she latch infant for every feeding  to help stimulate and establish milk supply and then supplement afterwards with formula. Birth Parent will limit total feedings to 30 minutes or less, BF for 20 minutes and  then supplement infant afterwards with formula. Birth Parent plans to offer infant 20 mls of formula after latching infant at the breast on day 2 for every feeding. Birth Parent knows to call [facility] if their are further BF questions, concerns or need further latch assistance.   Birth Parent is very tired , LC suggested Birth Parent rest after breastfeeding infant and supplementing infant with formula.  Maternal Data    Feeding Mother's Current Feeding Choice: Breast Milk and Formula  LATCH Score Latch: Grasps breast easily, tongue down, lips flanged, rhythmical sucking.  Audible Swallowing: A few with stimulation  Type of Nipple: Everted at rest and after stimulation  Comfort (Breast/Nipple): Soft / non-tender  Hold (Positioning): Assistance needed to correctly position infant at breast and maintain latch.  LATCH Score: 8   Lactation Tools Discussed/Used    Interventions Interventions: Skin to skin;Breast compression;Adjust position;Support pillows;Position options;Education;Pace feeding  Discharge    Consult Status       Vicente Serene 01/07/2022, 4:38 PM

## 2022-01-07 NOTE — Progress Notes (Signed)
Subjective: Postpartum Day 1: Cesarean Delivery Patient reports pain mainly on the left side of her abdomen.  Patient did have some lightheadedness with ambulation when she was walking in the hallway.  Tolerating p.o., denies nausea or vomiting.  Voiding without difficulty  Objective: Vital signs in last 24 hours: Temp:  [97.7 F (36.5 C)-98.5 F (36.9 C)] 97.8 F (36.6 C) (09/06 1204) Pulse Rate:  [73-96] 95 (09/06 1204) Resp:  [13-18] 16 (09/06 1204) BP: (98-114)/(59-70) 105/63 (09/06 1204) SpO2:  [99 %-100 %] 100 % (09/06 0529)  Physical Exam:  General: alert, cooperative, and appears stated age Lochia: appropriate Uterine Fundus: firm Incision: Pressure dressing clean dry and intact DVT Evaluation: No evidence of DVT seen on physical exam.  Recent Labs    01/06/22 1756 01/07/22 0430  HGB 9.4* 7.4*  HCT 25.7* 20.5*    Assessment/Plan: Status post Cesarean section. Doing well postoperatively.  Continue current care. Postop anemia: Patient's preop hemoglobin was 10.8, she experienced approximately 1300 cc of blood loss.  This morning her hemoglobin is 7.4.  Overall, her vitals have been stable with minimal hypotension or tachycardia.  Discussed with patient indications for transfusion.  Patient will continue attempts to ambulate.  If she is persistently dizzy with ambulation and recommended proceeding with transfusion. Vanessa Kick, MD 01/07/2022, 12:42 PM

## 2022-01-07 NOTE — Progress Notes (Signed)
Patient denies syncope and was told to let RN know if she starts to feel bad or dizzy.

## 2022-01-07 NOTE — Progress Notes (Signed)
Patient did well ambulating to bathroom this morning and voided 400 cc for the first time after foley removed. About an hour later, patient ambulated in hallway and once she got back to bed she complained of being dizzy. BP 114/64, HR 96. Encouraged patient to lie back in bed and rest. Patient had just had breakfast prior to ambulating. Encouraged increasing po fluid intake. MD still to round. Maxwell Caul, Leretha Dykes [place]

## 2022-01-08 DIAGNOSIS — D62 Acute posthemorrhagic anemia: Secondary | ICD-10-CM | POA: Diagnosis not present

## 2022-01-08 LAB — RH IG WORKUP (INCLUDES ABO/RH)
Fetal Screen: NEGATIVE
Gestational Age(Wks): 37
Unit division: 0

## 2022-01-08 MED ORDER — SODIUM CHLORIDE 0.9 % IV SOLN
500.0000 mg | Freq: Once | INTRAVENOUS | Status: AC
  Administered 2022-01-08: 500 mg via INTRAVENOUS
  Filled 2022-01-08: qty 25

## 2022-01-08 NOTE — Progress Notes (Signed)
Subjective: Postpartum Day 2: Cesarean Delivery Patient reports tolerating PO.  She is able to ambulate without dizziness but feels very fatigued.  Baby nursing well.  Pain controlled with oxycodone.   Objective: Vital signs in last 24 hours: Temp:  [98 F (36.7 C)-98.4 F (36.9 C)] 98.4 F (36.9 C) (09/07 1437) Pulse Rate:  [75-87] 87 (09/07 1437) Resp:  [16-18] 16 (09/07 1437) BP: (102-114)/(58-73) 114/73 (09/07 1437) SpO2:  [99 %-100 %] 99 % (09/07 1437)  Physical Exam:  General: cooperative and fatigued Lochia: appropriate Uterine Fundus: firm Incision: Dressing with small amount serosanguinous drainage   Recent Labs    01/06/22 1756 01/07/22 0430  HGB 9.4* 7.4*  HCT 25.7* 20.5*    Assessment/Plan: Status post Cesarean section. Postoperative course complicated by acute blood loss anemia.  Pt not symptomatic enough to warrant transfusion but could benefit from IV iron so will order infusion   Continue current care.  Logan Bores, MD 01/08/2022, 2:58 PM

## 2022-01-08 NOTE — Progress Notes (Signed)
Secure chat sent to [employer]. At this time patient is wanting to shower before PIV assessment/placement. Notified nurse to place consult when patient is in bed and ready for assessment/placement. Nurse VU. Fran Lowes, RN VAST

## 2022-01-09 MED ORDER — OXYCODONE HCL 5 MG PO TABS: 5.0000 mg | ORAL_TABLET | ORAL | 0 refills | Status: DC | PRN

## 2022-01-09 NOTE — Discharge Summary (Signed)
Postpartum Discharge Summary     Patient Name: Mackenzie Reeves DOB: 1985/06/03 MRN: [id number]  Date of admission: 01/06/2022 Delivery date:01/06/2022  Delivering provider: [name]  Date of discharge: 01/09/2022  Admitting diagnosis: Postpartum care following cesarean delivery [Z39.2] Intrauterine pregnancy: [redacted]w[redacted]d    Secondary diagnosis:  Principal Problem:   Postpartum care following cesarean delivery Active Problems:   Acute blood loss anemia     Discharge diagnosis: [facility] P[facility] Sceduled C/S   36y.o. yo G2P2002 at 334w0das admitted to the hospital 01/06/2022 for scheduled cesarean section with the following indication:Elective Repeat.Delivery details are as follows:  Membrane Rupture Time/Date: 10:43 AM ,01/06/2022   Delivery Method:C-Section, Low Transverse  Details of operation can be found in separate operative note.  Patient had an uncomplicated postpartum course.  She is ambulating, tolerating a regular diet, passing flatus, and urinating well. Patient is discharged home in stable condition on  01/09/22        Newborn Data: Birth date:01/06/2022  Birth time:10:44 AM  Gender:Female  Living status:Living  Apgars:8 ,9  Weight:2810 g      Physical exam  Vitals:   01/08/22 0519 01/08/22 1437 01/08/22 2030 01/09/22 0520  BP: 106/65 114/73 111/62 113/73  Pulse: 75 87 91 75  Resp: 18 16 (!) 22 17  Temp: 98.1 F (36.7 C) 98.4 F (36.9 C) 97.9 F (36.6 C) 98.7 F (37.1 C)  TempSrc: Oral Oral Oral Oral  SpO2: 100% 99% 100% 100%  Weight:      Height:       General: alert Lochia: appropriate Uterine Fundus: firm Incision: Healing well with no significant drainage  Labs: Lab Results  Component Value Date   WBC 12.6 (H) 01/07/2022   HGB 7.4 (L) 01/07/2022   HCT 20.5 (L) 01/07/2022   MCV 81.0 01/07/2022   PLT 149 (L) 01/07/2022       No data to display         Edinburgh  Score:    01/06/2022    1:50 PM  Edinburgh Postnatal Depression Scale Screening Tool  I have been able to laugh and see the funny side of things. 0  I have looked forward with enjoyment to things. 0  I have blamed myself unnecessarily when things went wrong. 0  I have been anxious or worried for no good reason. 0  I have felt scared or panicky for no good reason. 0  Things have been getting on top of me. 0  I have been so unhappy that I have had difficulty sleeping. 0  I have felt sad or miserable. 0  I have been so unhappy that I have been crying. 0  The thought of harming myself has occurred to me. 0  Edinburgh Postnatal Depression Scale Total 0      After visit meds:  Allergies as of 01/09/2022       Reactions   Nsaids Shortness Of Breath, Swelling, Rash   Facial swelling, difficulty breathing, rashes   Diclofenac Hives, Itching, Rash   Shrimp Extract Allergy Skin Test Rash        Medication List     TAKE these medications    acetaminophen 325 MG  tablet Commonly known as: TYLENOL Take 650 mg by mouth every 6 (six) hours as needed (pain.).   Blood Pressure Monitoring Devi 1 each by Does not apply route once a week.   cyclobenzaprine 10 MG tablet Commonly known as: FLEXERIL Take 1 tablet (10 mg total) by mouth 3 (three) times daily as needed for muscle spasms.   folic acid 1 MG tablet Commonly known as: FOLVITE Take 1 mg by mouth in the morning.   oxyCODONE 5 MG immediate release tablet Commonly known as: Oxy IR/ROXICODONE Take 1 tablet (5 mg total) by mouth every 4 (four) hours as needed for severe pain.   PNV-DHA 27-0.6-0.4-300 MG Caps Take 1 capsule by mouth in the morning.         Discharge home in stable condition Infant Feeding: Breast Infant Disposition:home with mother Discharge instruction: per After Visit Summary and Postpartum booklet. Activity: Advance as tolerated. Pelvic rest for 6 weeks.  Diet: routine diet Postpartum Appointment:2  weeks Future Appointments: Future Appointments  Date Time Provider [place]  03/17/2022 11:00 AM Genia Harold, MD [facility] None   Follow up Visit:  Stacy, [place], DO. Schedule an appointment as soon as possible for a visit in 2 week(s).   Specialty: Obstetrics and Gynecology Why: Incision check Contact information: [address] [place] [address] [phone]                     01/09/2022 Clarene Duke, MD

## 2022-01-09 NOTE — Progress Notes (Signed)
POD #3 LTCS Doing ok, pain ok, ready to go home Afeb, VSS Abd- soft, fundus firm, incision intact D/c home

## 2022-01-09 NOTE — Lactation Note (Signed)
This note was copied from a baby's chart. Lactation Consultation Note  Patient Name: Mackenzie Reeves Date: 01/09/2022 Reason for consult: Follow-up assessment;Mother's request;Early term 37-38.6wks;Breastfeeding assistance Age:37 hours LC assisted converting latch from cradle to cross cradle with increase in depth of swallows with breast compression. Infant still feeding at the end of the visit.   Plan 1. To feed based on cues 8-12x 24hr period. Birth parent to offer breasts and look for signs of milk transfer.  2. Birth parent to supplement with EBM with pace bottle feeding and slow flow nipple 3. Post pump after feeding for 15 mins.   Birth parent EBF for now. We discussed pumping and supplementing once she is at home.   All questions answered at the end of the visit.  Maternal Data Has patient been taught Hand Expression?: Yes Does the patient have breastfeeding experience prior to this delivery?: Yes How long did the patient breastfeed?: 16 months  Feeding Mother's Current Feeding Choice: Breast Milk  LATCH Score Latch: Repeated attempts needed to sustain latch, nipple held in mouth throughout feeding, stimulation needed to elicit sucking reflex.  Audible Swallowing: Spontaneous and intermittent  Type of Nipple: Everted at rest and after stimulation  Comfort (Breast/Nipple): Soft / non-tender  Hold (Positioning): Assistance needed to correctly position infant at breast and maintain latch.  LATCH Score: 8   Lactation Tools Discussed/Used Tools: Pump;Flanges Flange Size: 24 Breast pump type: Double-Electric Breast Pump Pump Education: Setup, frequency, and cleaning;Milk Storage Reason for Pumping: increase stimulation Pumping frequency: post pump after each feeding for 15 mins  Interventions Interventions: Breast feeding basics reviewed;Assisted with latch;Skin to skin;Breast massage;Hand express;Breast compression;Adjust position;Support pillows;Position  options;Expressed milk;DEBP;Education;Pace feeding;LC Magazine features editor;Infant Driven Feeding Algorithm education  Discharge Discharge Education: Engorgement and breast care;Warning signs for feeding baby Pump: DEBP WIC Program: Yes  Consult Status Consult Status: Complete Date: 01/09/22    Mackenzie Reeves  Mackenzie Reeves 01/09/2022, 12:11 PM

## 2022-01-09 NOTE — Lactation Note (Signed)
This note was copied from a baby's chart. Lactation Consultation Note  Patient Name: Mackenzie Reeves [id number] Date: 01/09/2022   Age:37 hours  Birth parent in the bathroom. RN, Twanna Hy to alert me when parent is ready for [facility] visit prior to discharge.   Maternal Data    Feeding    LATCH Score                    Lactation Tools Discussed/Used    Interventions    Discharge    Consult Status      Mackenzie Tatro  Reeves 01/09/2022, 11:22 AM

## 2022-01-09 NOTE — Social Work (Addendum)
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  CSW met with MOB to offer support and complete assessment.    CSW met with MOB at bedside and introduced CSW role. CSW observed MOB breastfeeding and responding appropriately to infant cues. MOB presented calm and welcomed CSW visit. CSW inquired how MOB has felt since giving birth. MOB stated, "it's been tough, I was in a lot  pain because my wound leaked fluid." MOB shared that the c-section was "a bit complicated and was two hours." MOB reported she has been feeling much better today. MOB reported upon transitioning home she will have support from her sister "Georgina". CSW inquired about MOB history of depression. MOB denied a history of depression. She stated during the pregnancy she felt sad and discussed this with her provider. The provider gave her a list of mental health resources for therapy. MOB reported in August she saw a therapist via [facility] virtual platform for three sessions that she felt was helpful. MOB reported she has access to the therapist if needed. CSW discussed PPD/PPA. 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 MOB reported she was living in [place] and stated, "I wasn't aware that I was even holding him." MOB reported she discussed her concerns with her doctor and was told to return the hospital for support. MOB reported the nurses helped her care for the infant allowing her to learn how to properly care for the infant and rest. MOB reported she eventually returned home feeling more prepared to care for the child. MOB reported this time she will have support from her sister and feels comfortable reaching out to her doctor if concerns arise.  CSW provided education regarding the baby blues period vs. perinatal mood disorders, discussed treatment and gave resources for mental health follow up if concerns arise.  CSW recommended MOB complete a self-evaluation during the postpartum  time period using the New Mom Checklist from Postpartum Progress and encouraged MOB to contact a medical professional if symptoms are noted at any time. CSW assessed MOB for safety. MOB denied thoughts of harm to self and others. MOB denied domestic violence concerns.   MOB reported she has all essential items to care for the infant including a bassinet where the infant will sleep. CSW provided review of Sudden Infant Death Syndrome (SIDS) precautions. MOB has chosen [facility] for the infant follow up care. CSW discussed community support. MOB politely declined. CSW assessed MOB for additional needs. MOB reported no further needs.   CSW identifies no further need for intervention and no barriers to discharge at this time.   Belmira Daley, MSW, LCSW [facility]  Clinical Social Worker  [phone] 01/09/2022  11:25 AM  

## 2022-01-09 NOTE — Discharge Instructions (Signed)
As per discharge pamphlet °

## 2022-01-15 ENCOUNTER — Telehealth (HOSPITAL_COMMUNITY): Payer: Self-pay | Admitting: *Deleted

## 2022-01-15 NOTE — Telephone Encounter (Signed)
Attempted Hospital Discharge Follow-Up Call.  Left voice mail requesting that patient return RN's phone call if patient has any concerns or questions regarding herself or her baby.  

## 2022-01-23 DIAGNOSIS — Z09 Encounter for follow-up examination after completed treatment for conditions other than malignant neoplasm: Secondary | ICD-10-CM | POA: Diagnosis not present

## 2022-01-23 DIAGNOSIS — R300 Dysuria: Secondary | ICD-10-CM | POA: Diagnosis not present

## 2022-03-05 DIAGNOSIS — Z3009 Encounter for other general counseling and advice on contraception: Secondary | ICD-10-CM | POA: Diagnosis not present

## 2022-03-05 DIAGNOSIS — Z1389 Encounter for screening for other disorder: Secondary | ICD-10-CM | POA: Diagnosis not present

## 2022-03-17 ENCOUNTER — Ambulatory Visit: Payer: Medicaid Other | Admitting: Psychiatry

## 2022-04-21 ENCOUNTER — Telehealth: Payer: Medicaid Other | Admitting: Physician Assistant

## 2022-04-21 DIAGNOSIS — B9689 Other specified bacterial agents as the cause of diseases classified elsewhere: Secondary | ICD-10-CM

## 2022-04-21 DIAGNOSIS — J019 Acute sinusitis, unspecified: Secondary | ICD-10-CM | POA: Diagnosis not present

## 2022-04-21 MED ORDER — AMOXICILLIN 875 MG PO TABS: 875.0000 mg | ORAL_TABLET | Freq: Two times a day (BID) | ORAL | 0 refills | Status: AC

## 2022-04-21 NOTE — Progress Notes (Signed)
Virtual Visit Consent   Mackenzie Reeves, you are scheduled for a virtual visit with a [facility] provider today. Just as with appointments in the office, your consent must be obtained to participate. Your consent will be active for this visit and any virtual visit you may have with one of our providers in the next 365 days. If you have a MyChart account, a copy of this consent can be sent to you electronically.  As this is a virtual visit, video technology does not allow for your provider to perform a traditional examination. This may limit your provider's ability to fully assess your condition. If your provider identifies any concerns that need to be evaluated in person or the need to arrange testing (such as labs, EKG, etc.), we will make arrangements to do so. Although advances in technology are sophisticated, we cannot ensure that it will always work on either your end or our end. If the connection with a video visit is poor, the visit may have to be switched to a telephone visit. With either a video or telephone visit, we are not always able to ensure that we have a secure connection.  By engaging in this virtual visit, you consent to the provision of healthcare and authorize for your insurance to be billed (if applicable) for the services provided during this visit. Depending on your insurance coverage, you may receive a charge related to this service.  I need to obtain your verbal consent now. Are you willing to proceed with your visit today? [name] has provided verbal consent on 04/21/2022 for a virtual visit (video or telephone). Mackenzie Reeves, [place]  Date: 04/21/2022 8:25 AM  Virtual Visit via Video Note   I, Mackenzie Reeves, connected with  Mackenzie Reeves  ([id number], 09-16-1985) on 04/21/22 at  8:00 AM EST by a video-enabled telemedicine application and verified that I am speaking with the correct person using two identifiers.  Location: Patient: Virtual Visit Location Patient:  Home Provider: Virtual Visit Location Provider: Home Office   I discussed the limitations of evaluation and management by telemedicine and the availability of in person appointments. The patient expressed understanding and agreed to proceed.    History of Present Illness: Mackenzie Reeves is a 36 y.o. who identifies as a female who was assigned female at birth, and is being seen today for 2 weeks of upper respiratory and symptoms starting with nasal congestion, rhinorrhea and dry cough.  For this she started OTC DayQuil without much improvement.  Over the past week symptoms have significantly worsened with increased nasal congestion and development of thick brown nasal discharge with sinus pain and upper tooth pain.  Denies fevers, chills or aches.  Denies chest pain or shortness of breath.  Has also started Mucinex DM OTC as well as a nasal decongestant spray.  She currently has a 82-monthold and is breast-feeding.  Denies any concern for pregnancy.   HPI: HPI  Problems:  Patient Active Problem List   Diagnosis Date Noted   Acute blood loss anemia 01/08/2022   Postpartum care following cesarean delivery 01/06/2022   Supervision of other normal pregnancy, antepartum 07/16/2021   Migraines 07/16/2021   History of cesarean delivery 07/16/2021    Allergies:  Allergies  Allergen Reactions   Nsaids Shortness Of Breath, Swelling and Rash    Facial swelling, difficulty breathing, rashes   Diclofenac Hives, Itching and Rash   Shrimp Extract Allergy Skin Test Rash   Medications:  Current Outpatient Medications:  amoxicillin (AMOXIL) 875 MG tablet, Take 1 tablet (875 mg total) by mouth 2 (two) times daily for 10 days., Disp: 20 tablet, Rfl: 0   acetaminophen (TYLENOL) 325 MG tablet, Take 650 mg by mouth every 6 (six) hours as needed (pain.)., Disp: , Rfl:    Blood Pressure Monitoring DEVI, 1 each by Does not apply route once a week., Disp: 1 each, Rfl: 0   folic acid (FOLVITE) 1 MG tablet, Take 1  mg by mouth in the morning., Disp: , Rfl:    Prenat w/o A-FE-Methfol-FA-DHA (PNV-DHA) 27-0.6-0.4-300 MG CAPS, Take 1 capsule by mouth in the morning., Disp: , Rfl:   Observations/Objective: Patient is well-developed, well-nourished in no acute distress.  Resting comfortably at home.  Head is normocephalic, atraumatic.  No labored breathing. Speech is clear and coherent with logical content.  Patient is alert and oriented at baseline.   Assessment and Plan: 1. Acute bacterial sinusitis - amoxicillin (AMOXIL) 875 MG tablet; Take 1 tablet (875 mg total) by mouth 2 (two) times daily for 10 days.  Dispense: 20 tablet; Refill: 0  In breast-feeding patient.  Safe OTC medications reviewed with patient.  Stop nasal decongestant spray as she has been using for greater then 2 days, due to concern for rebound congestion. Rx amoxicillin.  Increase fluids.  Rest.  Saline nasal spray.  Probiotic.  Mucinex as directed.  Humidifier in bedroom.  Call or return to clinic if symptoms are not improving.   Follow Up Instructions: I discussed the assessment and treatment plan with the patient. The patient was provided an opportunity to ask questions and all were answered. The patient agreed with the plan and demonstrated an understanding of the instructions.  A copy of instructions were sent to the patient via MyChart unless otherwise noted below.   The patient was advised to call back or seek an in-person evaluation if the symptoms worsen or if the condition fails to improve as anticipated.  Time:  I spent 10 minutes with the patient via telehealth technology discussing the above problems/concerns.    Mackenzie Rio, PA-C

## 2022-04-21 NOTE — Patient Instructions (Signed)
Caffie Damme, thank you for joining [name], PA-C for today's virtual visit.  While this provider is not your primary care provider (PCP), if your PCP is located in our provider database this encounter information will be shared with them immediately following your visit.   [facility] account gives you access to today's visit and all your visits, tests, and labs performed at [facility] " click here if you don't have a [facility] account or go to [email or web address]  Consent: (Patient) Mackenzie Reeves provided verbal consent for this virtual visit at the beginning of the encounter.  Current Medications:  Current Outpatient Medications:    amoxicillin (AMOXIL) 875 MG tablet, Take 1 tablet (875 mg total) by mouth 2 (two) times daily for 10 days., Disp: 20 tablet, Rfl: 0   acetaminophen (TYLENOL) 325 MG tablet, Take 650 mg by mouth every 6 (six) hours as needed (pain.)., Disp: , Rfl:    Blood Pressure Monitoring DEVI, 1 each by Does not apply route once a week., Disp: 1 each, Rfl: 0   folic acid (FOLVITE) 1 MG tablet, Take 1 mg by mouth in the morning., Disp: , Rfl:    Prenat w/o A-FE-Methfol-FA-DHA (PNV-DHA) 27-0.6-0.4-300 MG CAPS, Take 1 capsule by mouth in the morning., Disp: , Rfl:    Medications ordered in this encounter:  Meds ordered this encounter  Medications   amoxicillin (AMOXIL) 875 MG tablet    Sig: Take 1 tablet (875 mg total) by mouth 2 (two) times daily for 10 days.    Dispense:  20 tablet    Refill:  0    Order Specific Question:   Supervising Provider    Answer:   Chase Picket [id number]     *If you need refills on other medications prior to your next appointment, please contact your pharmacy*  Follow-Up: Call back or seek an in-person evaluation if the symptoms worsen or if the condition fails to improve as anticipated.  [facility] [phone]  Other Instructions Please take antibiotic as directed.   Increase fluid intake.  Use Saline nasal spray.  Take a daily multivitamin. Ok to continue use of Mucinex-DM. You can use a nasal steroid spray like Flonase for a few days.  Place a humidifier in the bedroom.  Please call or return clinic if symptoms are not improving.  Sinusitis Sinusitis is redness, soreness, and swelling (inflammation) of the paranasal sinuses. Paranasal sinuses are air pockets within the bones of your face (beneath the eyes, the middle of the forehead, or above the eyes). In healthy paranasal sinuses, mucus is able to drain out, and air is able to circulate through them by way of your nose. However, when your paranasal sinuses are inflamed, mucus and air can become trapped. This can allow bacteria and other germs to grow and cause infection. Sinusitis can develop quickly and last only a short time (acute) or continue over a long period (chronic). Sinusitis that lasts for more than 12 weeks is considered chronic.  CAUSES  Causes of sinusitis include: Allergies. Structural abnormalities, such as displacement of the cartilage that separates your nostrils (deviated septum), which can decrease the air flow through your nose and sinuses and affect sinus drainage. Functional abnormalities, such as when the small hairs (cilia) that line your sinuses and help remove mucus do not work properly or are not present. SYMPTOMS  Symptoms of acute and chronic sinusitis are the same. The primary symptoms are pain and pressure around  the affected sinuses. Other symptoms include: Upper toothache. Earache. Headache. Bad breath. Decreased sense of smell and taste. A cough, which worsens when you are lying flat. Fatigue. Fever. Thick drainage from your nose, which often is green and may contain pus (purulent). Swelling and warmth over the affected sinuses. DIAGNOSIS  Your caregiver will perform a physical exam. During the exam, your caregiver may: Look in your nose for signs of abnormal growths  in your nostrils (nasal polyps). Tap over the affected sinus to check for signs of infection. View the inside of your sinuses (endoscopy) with a special imaging device with a light attached (endoscope), which is inserted into your sinuses. If your caregiver suspects that you have chronic sinusitis, one or more of the following tests may be recommended: Allergy tests. Nasal culture A sample of mucus is taken from your nose and sent to a lab and screened for bacteria. Nasal cytology A sample of mucus is taken from your nose and examined by your caregiver to determine if your sinusitis is related to an allergy. TREATMENT  Most cases of acute sinusitis are related to a viral infection and will resolve on their own within 10 days. Sometimes medicines are prescribed to help relieve symptoms (pain medicine, decongestants, nasal steroid sprays, or saline sprays).  However, for sinusitis related to a bacterial infection, your caregiver will prescribe antibiotic medicines. These are medicines that will help kill the bacteria causing the infection.  Rarely, sinusitis is caused by a fungal infection. In theses cases, your caregiver will prescribe antifungal medicine. For some cases of chronic sinusitis, surgery is needed. Generally, these are cases in which sinusitis recurs more than 3 times per year, despite other treatments. HOME CARE INSTRUCTIONS  Drink plenty of water. Water helps thin the mucus so your sinuses can drain more easily. Use a humidifier. Inhale steam 3 to 4 times a day (for example, sit in the bathroom with the shower running). Apply a warm, moist washcloth to your face 3 to 4 times a day, or as directed by your caregiver. Use saline nasal sprays to help moisten and clean your sinuses. Take over-the-counter or prescription medicines for pain, discomfort, or fever only as directed by your caregiver. SEEK IMMEDIATE MEDICAL CARE IF: You have increasing pain or severe headaches. You have  nausea, vomiting, or drowsiness. You have swelling around your face. You have vision problems. You have a stiff neck. You have difficulty breathing. MAKE SURE YOU:  Understand these instructions. Will watch your condition. Will get help right away if you are not doing well or get worse. Document Released: 04/20/2005 Document Revised: 07/13/2011 Document Reviewed: 05/05/2011 [facility] Patient Information 2014 [place], [place].    If you have been instructed to have an in-person evaluation today at a local Urgent Care facility, please use the link below. It will take you to a list of all of our available [facility], including address, phone number and hours of operation. Please do not delay care.  [facility]  If you or a family member do not have a primary care provider, use the link below to schedule a visit and establish care. When you choose a [facility] primary care physician or advanced practice provider, you gain a long-term partner in health. Find a Primary Care Provider  Learn more about [facility] in-office and virtual care options: [facility]

## 2022-06-11 ENCOUNTER — Telehealth: Payer: Medicaid Other | Admitting: Nurse Practitioner

## 2022-06-11 DIAGNOSIS — B379 Candidiasis, unspecified: Secondary | ICD-10-CM | POA: Diagnosis not present

## 2022-06-11 MED ORDER — FLUCONAZOLE 150 MG PO TABS: 150.0000 mg | ORAL_TABLET | Freq: Once | ORAL | 0 refills | Status: AC

## 2022-06-11 NOTE — Progress Notes (Signed)
Virtual Visit Consent   [name], you are scheduled for a virtual visit with a [facility] provider today. Just as with appointments in the office, your consent must be obtained to participate. Your consent will be active for this visit and any virtual visit you may have with one of our providers in the next 365 days. If you have a MyChart account, a copy of this consent can be sent to you electronically.  As this is a virtual visit, video technology does not allow for your provider to perform a traditional examination. This may limit your provider's ability to fully assess your condition. If your provider identifies any concerns that need to be evaluated in person or the need to arrange testing (such as labs, EKG, etc.), we will make arrangements to do so. Although advances in technology are sophisticated, we cannot ensure that it will always work on either your end or our end. If the connection with a video visit is poor, the visit may have to be switched to a telephone visit. With either a video or telephone visit, we are not always able to ensure that we have a secure connection.  By engaging in this virtual visit, you consent to the provision of healthcare and authorize for your insurance to be billed (if applicable) for the services provided during this visit. Depending on your insurance coverage, you may receive a charge related to this service.  I need to obtain your verbal consent now. Are you willing to proceed with your visit today? Mackenzie Reeves has provided verbal consent on 06/11/2022 for a virtual visit (video or telephone). Mackenzie Schneiders, FNP  Date: 06/11/2022 10:12 AM  Virtual Visit via Video Note   I, Mackenzie Reeves, connected with  Mackenzie Reeves  ([id number], [birth year]) on 06/11/22 at 10:15 AM EST by a video-enabled telemedicine application and verified that I am speaking with the correct person using two identifiers.  Location: Patient: Virtual Visit Location Patient: Home Provider:  Virtual Visit Location Provider: Home Office   I discussed the limitations of evaluation and management by telemedicine and the availability of in person appointments. The patient expressed understanding and agreed to proceed.    History of Present Illness: Mackenzie Reeves is a 37 y.o. who identifies as a female who was assigned female at birth, and is being seen today for pain in pelvic region.  Symptom onset was 4 days ago   She has also noted vaginal itching with discharge  She was treated with antibiotics recently  Denies fever   She denies a chance of pregnancy she is breastfeeding   Problems:  Patient Active Problem List   Diagnosis Date Noted   Acute blood loss anemia 01/08/2022   Postpartum care following cesarean delivery 01/06/2022   Supervision of other normal pregnancy, antepartum 07/16/2021   Migraines 07/16/2021   History of cesarean delivery 07/16/2021    Allergies:  Allergies  Allergen Reactions   Nsaids Shortness Of Breath, Swelling and Rash    Facial swelling, difficulty breathing, rashes   Diclofenac Hives, Itching and Rash   Shrimp Extract Allergy Skin Test Rash   Medications:  Current Outpatient Medications:    acetaminophen (TYLENOL) 325 MG tablet, Take 650 mg by mouth every 6 (six) hours as needed (pain.)., Disp: , Rfl:    Blood Pressure Monitoring DEVI, 1 each by Does not apply route once a week., Disp: 1 each, Rfl: 0   folic acid (FOLVITE) 1 MG tablet, Take 1 mg by mouth in the morning.,  Disp: , Rfl:    Prenat w/o A-FE-Methfol-FA-DHA (PNV-DHA) 27-0.6-0.4-300 MG CAPS, Take 1 capsule by mouth in the morning., Disp: , Rfl:   Observations/Objective: Patient is well-developed, well-nourished in no acute distress.  Resting comfortably  at home.  Head is normocephalic, atraumatic.  No labored breathing.  Speech is clear and coherent with logical content.  Patient is alert and oriented at baseline.    Assessment and Plan: 1. Yeast infection  -  fluconazole (DIFLUCAN) 150 MG tablet; Take 1 tablet (150 mg total) by mouth once for 1 dose.  Dispense: 1 tablet; Refill: 0     Follow Up Instructions: I discussed the assessment and treatment plan with the patient. The patient was provided an opportunity to ask questions and all were answered. The patient agreed with the plan and demonstrated an understanding of the instructions.  A copy of instructions were sent to the patient via MyChart unless otherwise noted below.    The patient was advised to call back or seek an in-person evaluation if the symptoms worsen or if the condition fails to improve as anticipated.  Time:  I spent 10 minutes with the patient via telehealth technology discussing the above problems/concerns.    Mackenzie Schneiders, FNP

## 2022-07-16 DIAGNOSIS — Z01419 Encounter for gynecological examination (general) (routine) without abnormal findings: Secondary | ICD-10-CM | POA: Diagnosis not present

## 2022-07-16 DIAGNOSIS — Z1389 Encounter for screening for other disorder: Secondary | ICD-10-CM | POA: Diagnosis not present

## 2022-07-16 DIAGNOSIS — Z124 Encounter for screening for malignant neoplasm of cervix: Secondary | ICD-10-CM | POA: Diagnosis not present

## 2022-07-16 DIAGNOSIS — N760 Acute vaginitis: Secondary | ICD-10-CM | POA: Diagnosis not present

## 2022-07-16 DIAGNOSIS — Z130 Encounter for screening for diseases of the blood and blood-forming organs and certain disorders involving the immune mechanism: Secondary | ICD-10-CM | POA: Diagnosis not present

## 2022-07-16 DIAGNOSIS — R87612 Low grade squamous intraepithelial lesion on cytologic smear of cervix (LGSIL): Secondary | ICD-10-CM | POA: Diagnosis not present

## 2022-07-16 DIAGNOSIS — L293 Anogenital pruritus, unspecified: Secondary | ICD-10-CM | POA: Diagnosis not present

## 2022-09-18 DIAGNOSIS — R87616 Satisfactory cervical smear but lacking transformation zone: Secondary | ICD-10-CM | POA: Diagnosis not present

## 2022-09-18 DIAGNOSIS — R87612 Low grade squamous intraepithelial lesion on cytologic smear of cervix (LGSIL): Secondary | ICD-10-CM | POA: Diagnosis not present

## 2022-11-20 ENCOUNTER — Telehealth: Payer: Medicaid Other | Admitting: Nurse Practitioner

## 2022-11-20 DIAGNOSIS — H1012 Acute atopic conjunctivitis, left eye: Secondary | ICD-10-CM | POA: Diagnosis not present

## 2022-11-20 DIAGNOSIS — H1032 Unspecified acute conjunctivitis, left eye: Secondary | ICD-10-CM

## 2022-11-20 MED ORDER — POLYMYXIN B-TRIMETHOPRIM 10000-0.1 UNIT/ML-% OP SOLN: 1.0000 [drp] | OPHTHALMIC | 0 refills | Status: AC

## 2022-11-20 MED ORDER — OLOPATADINE HCL 0.1 % OP SOLN: 1.0000 [drp] | Freq: Two times a day (BID) | OPHTHALMIC | 12 refills | Status: DC

## 2022-11-20 NOTE — Progress Notes (Signed)
Virtual Visit Consent   Mackenzie Reeves, you are scheduled for a virtual visit with a [facility] provider today. Just as with appointments in the office, your consent must be obtained to participate. Your consent will be active for this visit and any virtual visit you may have with one of our providers in the next 365 days. If you have a MyChart account, a copy of this consent can be sent to you electronically.  As this is a virtual visit, video technology does not allow for your provider to perform a traditional examination. This may limit your provider's ability to fully assess your condition. If your provider identifies any concerns that need to be evaluated in person or the need to arrange testing (such as labs, EKG, etc.), we will make arrangements to do so. Although advances in technology are sophisticated, we cannot ensure that it will always work on either your end or our end. If the connection with a video visit is poor, the visit may have to be switched to a telephone visit. With either a video or telephone visit, we are not always able to ensure that we have a secure connection.  By engaging in this virtual visit, you consent to the provision of healthcare and authorize for your insurance to be billed (if applicable) for the services provided during this visit. Depending on your insurance coverage, you may receive a charge related to this service.  I need to obtain your verbal consent now. Are you willing to proceed with your visit today? Mackenzie Reeves has provided verbal consent on 11/20/2022 for a virtual visit (video or telephone). Mackenzie Simas, FNP  Date: 11/20/2022 11:45 AM  Virtual Visit via Video Note   I, Mackenzie Reeves, connected with  [name]  ([id number], 1985/05/12) on 11/20/22 at 11:45 AM EDT by a video-enabled telemedicine application and verified that I am speaking with the correct person using two identifiers.  Location: Patient: Virtual Visit Location Patient: Home Provider:  Virtual Visit Location Provider: Home Office   I discussed the limitations of evaluation and management by telemedicine and the availability of in person appointments. The patient expressed understanding and agreed to proceed.    History of Present Illness: Mackenzie Reeves is a 37 y.o. who identifies as a female who was assigned female at birth, and is being seen today for redness in her eye.  Yesterday she went for a walk and after returning she noted her left eye had turned red  The eye is also painful  This morning her eye continues to cause her pain  She had drainage this morning   She is able to see  Feels better to cover eye   Her eye does not feel scratched at all  Her eye does not itch   Denies any recent illness  She has been sneezing since yesterday   She does not wear contacts  She does have small children at home but denies any sick contacts   Problems:  Patient Active Problem List   Diagnosis Date Noted   Acute blood loss anemia 01/08/2022   Postpartum care following cesarean delivery 01/06/2022   Supervision of other normal pregnancy, antepartum 07/16/2021   Migraines 07/16/2021   History of cesarean delivery 07/16/2021    Allergies:  Allergies  Allergen Reactions   Nsaids Shortness Of Breath, Swelling and Rash    Facial swelling, difficulty breathing, rashes   Diclofenac Hives, Itching and Rash   Shrimp Extract Rash   Medications:  Current Outpatient Medications:  acetaminophen (TYLENOL) 325 MG tablet, Take 650 mg by mouth every 6 (six) hours as needed (pain.)., Disp: , Rfl:    Blood Pressure Monitoring DEVI, 1 each by Does not apply route once a week., Disp: 1 each, Rfl: 0   folic acid (FOLVITE) 1 MG tablet, Take 1 mg by mouth in the morning., Disp: , Rfl:    Prenat w/o A-FE-Methfol-FA-DHA (PNV-DHA) 27-0.6-0.4-300 MG CAPS, Take 1 capsule by mouth in the morning., Disp: , Rfl:   Observations/Objective: Patient is well-developed, well-nourished in no acute  distress.  Resting comfortably  at home.  Head is normocephalic, atraumatic.  No labored breathing.  Speech is clear and coherent with logical content.  Patient is alert and oriented at baseline.    Assessment and Plan: 1. Acute bacterial conjunctivitis of left eye  - trimethoprim-polymyxin b (POLYTRIM) ophthalmic solution; Place 1 drop into the left eye every 4 (four) hours for 5 days.  Dispense: 10 mL; Refill: 0  2.DdxAllergic conjunctivitis of left eye  - olopatadine (PATANOL) 0.1 % ophthalmic solution; Place 1 drop into both eyes 2 (two) times daily.  Dispense: 5 mL; Refill: 12    If redness persists or with changes in vision persist in person evaluation   Follow Up Instructions: I discussed the assessment and treatment plan with the patient. The patient was provided an opportunity to ask questions and all were answered. The patient agreed with the plan and demonstrated an understanding of the instructions.  A copy of instructions were sent to the patient via MyChart unless otherwise noted below.    The patient was advised to call back or seek an in-person evaluation if the symptoms worsen or if the condition fails to improve as anticipated.  Time:  I spent 15 minutes with the patient via telehealth technology discussing the above problems/concerns.    Mackenzie Simas, FNP

## 2023-06-15 ENCOUNTER — Telehealth: Payer: Medicaid Other | Admitting: Physician Assistant

## 2023-06-15 DIAGNOSIS — J208 Acute bronchitis due to other specified organisms: Secondary | ICD-10-CM | POA: Diagnosis not present

## 2023-06-15 DIAGNOSIS — B9689 Other specified bacterial agents as the cause of diseases classified elsewhere: Secondary | ICD-10-CM

## 2023-06-15 DIAGNOSIS — Z20828 Contact with and (suspected) exposure to other viral communicable diseases: Secondary | ICD-10-CM

## 2023-06-15 MED ORDER — BENZONATATE 100 MG PO CAPS: 100.0000 mg | ORAL_CAPSULE | Freq: Three times a day (TID) | ORAL | 0 refills | Status: DC | PRN

## 2023-06-15 MED ORDER — AZITHROMYCIN 250 MG PO TABS: ORAL_TABLET | ORAL | 0 refills | Status: AC

## 2023-06-15 NOTE — Progress Notes (Signed)
Virtual Visit Consent   Mackenzie Reeves, you are scheduled for a virtual visit with a [facility] provider today. Just as with appointments in the office, your consent must be obtained to participate. Your consent will be active for this visit and any virtual visit you may have with one of our providers in the next 365 days. If you have a MyChart account, a copy of this consent can be sent to you electronically.  As this is a virtual visit, video technology does not allow for your provider to perform a traditional examination. This may limit your provider's ability to fully assess your condition. If your provider identifies any concerns that need to be evaluated in person or the need to arrange testing (such as labs, EKG, etc.), we will make arrangements to do so. Although advances in technology are sophisticated, we cannot ensure that it will always work on either your end or our end. If the connection with a video visit is poor, the visit may have to be switched to a telephone visit. With either a video or telephone visit, we are not always able to ensure that we have a secure connection.  By engaging in this virtual visit, you consent to the provision of healthcare and authorize for your insurance to be billed (if applicable) for the services provided during this visit. Depending on your insurance coverage, you may receive a charge related to this service.  I need to obtain your verbal consent now. Are you willing to proceed with your visit today? Mackenzie Reeves has provided verbal consent on 06/15/2023 for a virtual visit (video or telephone). Mackenzie Reeves, [place]  Date: 06/15/2023 2:01 PM   Virtual Visit via Video Note   I, Mackenzie Reeves, connected with  Mackenzie Reeves  ([id number], 1985-11-24) on 06/15/23 at  2:00 PM EST by a video-enabled telemedicine application and verified that I am speaking with the correct person using two identifiers.  Location: Patient: Virtual Visit Location Patient:  Home Provider: Virtual Visit Location Provider: Home Office   I discussed the limitations of evaluation and management by telemedicine and the availability of in person appointments. The patient expressed understanding and agreed to proceed.    History of Present Illness: Mackenzie Reeves is a 38 y.o. who identifies as a female who was assigned female at birth, and is being seen today for URI symptoms. Notes Friday last week fever, nasal congestion. Saturday with body aches, chills, fatigue and continued URI symptoms. Son with similar symptoms. Was seen and diagnosed with flu A. Since then symptoms have continued to progress. No with increased chest congestion, sinus pain, thick brown phlegm. Chest tenderness with coughing. Still with fever, unsure of Tmax.   OTC -- Nyquil.   HPI: HPI  Problems:  Patient Active Problem List   Diagnosis Date Noted   Acute blood loss anemia 01/08/2022   Postpartum care following cesarean delivery 01/06/2022   Supervision of other normal pregnancy, antepartum 07/16/2021   Migraines 07/16/2021   History of cesarean delivery 07/16/2021    Allergies:  Allergies  Allergen Reactions   Nsaids Shortness Of Breath, Swelling and Rash    Facial swelling, difficulty breathing, rashes   Diclofenac Hives, Itching and Rash   Shrimp Extract Rash   Medications:  Current Outpatient Medications:    azithromycin (ZITHROMAX) 250 MG tablet, Take 2 tablets on day 1, then 1 tablet daily on days 2 through 5, Disp: 6 tablet, Rfl: 0   benzonatate (TESSALON) 100 MG capsule, Take 1  capsule (100 mg total) by mouth 3 (three) times daily as needed for cough., Disp: 30 capsule, Rfl: 0   folic acid (FOLVITE) 1 MG tablet, Take 1 mg by mouth in the morning., Disp: , Rfl:   Observations/Objective: Patient is well-developed, well-nourished in no acute distress.  Resting comfortably  at home.  Head is normocephalic, atraumatic.  No labored breathing.  Speech is clear and coherent with  logical content.  Patient is alert and oriented at baseline.   Assessment and Plan: 1. Acute bacterial bronchitis (Primary) - azithromycin (ZITHROMAX) 250 MG tablet; Take 2 tablets on day 1, then 1 tablet daily on days 2 through 5  Dispense: 6 tablet; Refill: 0 - benzonatate (TESSALON) 100 MG capsule; Take 1 capsule (100 mg total) by mouth 3 (three) times daily as needed for cough.  Dispense: 30 capsule; Refill: 0  2. Exposure to the flu - benzonatate (TESSALON) 100 MG capsule; Take 1 capsule (100 mg total) by mouth 3 (three) times daily as needed for cough.  Dispense: 30 capsule; Refill: 0  Known exposure to flu with classic symptoms starting end of last week. Outside of time window for antiviral. Giving continued fever and change in sputum production with other progressing URI symptoms, concern for secondary bronchitis. Supportive measures and OTC medications reviewed. Azithromycin and Tessalon per orders. Strict in person evaluation precautions discussed.   Follow Up Instructions: I discussed the assessment and treatment plan with the patient. The patient was provided an opportunity to ask questions and all were answered. The patient agreed with the plan and demonstrated an understanding of the instructions.  A copy of instructions were sent to the patient via MyChart unless otherwise noted below.   The patient was advised to call back or seek an in-person evaluation if the symptoms worsen or if the condition fails to improve as anticipated.    Mackenzie Climes, PA-C

## 2023-06-15 NOTE — Patient Instructions (Addendum)
Garner Gavel, thank you for joining Piedad Climes, PA-C for today's virtual visit.  While this provider is not your primary care provider (PCP), if your PCP is located in our provider database this encounter information will be shared with them immediately following your visit.   A [facility] MyChart account gives you access to today's visit and all your visits, tests, and labs performed at [facility] " click here if you don't have a [facility] MyChart account or go to [email or web address]  Consent: (Patient) Mackenzie Reeves provided verbal consent for this virtual visit at the beginning of the encounter.  Current Medications:  Current Outpatient Medications:    acetaminophen (TYLENOL) 325 MG tablet, Take 650 mg by mouth every 6 (six) hours as needed (pain.)., Disp: , Rfl:    Blood Pressure Monitoring DEVI, 1 each by Does not apply route once a week., Disp: 1 each, Rfl: 0   folic acid (FOLVITE) 1 MG tablet, Take 1 mg by mouth in the morning., Disp: , Rfl:    olopatadine (PATANOL) 0.1 % ophthalmic solution, Place 1 drop into both eyes 2 (two) times daily., Disp: 5 mL, Rfl: 12   Prenat w/o A-FE-Methfol-FA-DHA (PNV-DHA) 27-0.6-0.4-300 MG CAPS, Take 1 capsule by mouth in the morning., Disp: , Rfl:    Medications ordered in this encounter:  No orders of the defined types were placed in this encounter.    *If you need refills on other medications prior to your next appointment, please contact your pharmacy*  Follow-Up: Call back or seek an in-person evaluation if the symptoms worsen or if the condition fails to improve as anticipated.   Virtual Care [phone]  Other Instructions Please keep well-hydrated and try to get plenty of rest. If you have a humidifier, place it in the bedroom and run it at night. Start a saline nasal rinse for nasal congestion. You can consider use of a nasal steroid spray like Flonase or Nasacort OTC. You can use Tylenol if  needed for fever, body aches, headache and/or throat pain. Salt water-gargles and chloraseptic spray can be very beneficial for sore throat. Mucinex-DM for congestion or cough. Please take all prescribed medications as directed.  Remain out of work until [employer] for 24 hours without a fever-reducing medication, and you are feeling better.  You should mask until symptoms are resolved.  If anything worsens despite treatment, you need to be evaluated in-person. Please do not delay care.  Influenza, Adult Influenza is also called "the flu." It is an infection in the lungs, nose, and throat (respiratory tract). It spreads easily from person to person (is contagious). The flu causes symptoms that are like a cold, along with high fever and body aches. What are the causes? This condition is caused by the influenza virus. You can get the virus by: Breathing in droplets that are in the air after a person infected with the flu coughed or sneezed. Touching something that has the virus on it and then touching your mouth, nose, or eyes. What increases the risk? Certain things may make you more likely to get the flu. These include: Not washing your hands often. Having close contact with many people during cold and flu season. Touching your mouth, eyes, or nose without first washing your hands. Not getting a flu shot every year. You may have a higher risk for the flu, and serious problems, such as a lung infection (pneumonia), if you: Are older than 65. Are pregnant. Have a weakened disease-fighting system (  immune system) because of a disease or because you are taking certain medicines. Have a long-term (chronic) condition, such as: Heart, kidney, or lung disease. Diabetes. Asthma. Have a liver disorder. Are very overweight (morbidly obese). Have anemia. What are the signs or symptoms? Symptoms usually begin suddenly and last 4-14 days. They may include: Fever and chills. Headaches, body aches,  or muscle aches. Sore throat. Cough. Runny or stuffy (congested) nose. Feeling discomfort in your chest. Not wanting to eat as much as normal. Feeling weak or tired. Feeling dizzy. Feeling sick to your stomach or throwing up. How is this treated? If the flu is found early, you can be treated with antiviral medicine. This can help to reduce how bad the illness is and how long it lasts. This may be given by mouth or through an IV tube. Taking care of yourself at home can help your symptoms get better. Your doctor may want you to: Take over-the-counter medicines. Drink plenty of fluids. The flu often goes away on its own. If you have very bad symptoms or other problems, you may be treated in a hospital. Follow these instructions at home:     Activity Rest as needed. Get plenty of sleep. Stay home from work or school as told by your doctor. Do not leave home until you do not have a fever for 24 hours without taking medicine. Leave home only to go to your doctor. Eating and drinking Take an ORS (oral rehydration solution). This is a drink that is sold at pharmacies and stores. Drink enough fluid to keep your pee pale yellow. Drink clear fluids in small amounts as you are able. Clear fluids include: Water. Ice chips. Fruit juice mixed with water. Low-calorie sports drinks. Eat bland foods that are easy to digest. Eat small amounts as you are able. These foods include: Bananas. Applesauce. Rice. Lean meats. Toast. Crackers. Do not eat or drink: Fluids that have a lot of sugar or caffeine. Alcohol. Spicy or fatty foods. General instructions Take over-the-counter and prescription medicines only as told by your doctor. Use a cool mist humidifier to add moisture to the air in your home. This can make it easier for you to breathe. When using a cool mist humidifier, clean it daily. Empty water and replace with clean water. Cover your mouth and nose when you cough or sneeze. Wash  your hands with soap and water often and for at least 20 seconds. This is also important after you cough or sneeze. If you cannot use soap and water, use alcohol-based hand sanitizer. Keep all follow-up visits. How is this prevented?  Get a flu shot every year. You may get the flu shot in late summer, fall, or winter. Ask your doctor when you should get your flu shot. Avoid contact with people who are sick during fall and winter. This is cold and flu season. Contact a doctor if: You get new symptoms. You have: Chest pain. Watery poop (diarrhea). A fever. Your cough gets worse. You start to have more mucus. You feel sick to your stomach. You throw up. Get help right away if you: Have shortness of breath. Have trouble breathing. Have skin or nails that turn a bluish color. Have very bad pain or stiffness in your neck. Get a sudden headache. Get sudden pain in your face or ear. Cannot eat or drink without throwing up. These symptoms may represent a serious problem that is an emergency. Get medical help right away. Call your local emergency services (  911 in the [place] Do not wait to see if the symptoms will go away. Do not drive yourself to the hospital. Summary Influenza is also called "the flu." It is an infection in the lungs, nose, and throat. It spreads easily from person to person. Take over-the-counter and prescription medicines only as told by your doctor. Getting a flu shot every year is the best way to not get the flu. This information is not intended to replace advice given to you by your health care provider. Make sure you discuss any questions you have with your health care provider. Document Revised: 12/08/2019 Document Reviewed: 12/08/2019 Elsevier Patient Education  2023 Elsevier Inc[employer]      If you have been instructed to have an in-person evaluation today at a local Urgent Care facility, please use the link below. It will take you to a list of all of our available  [facility], including address, phone number and hours of operation. Please do not delay care.  [facility]  If you or a family member do not have a primary care provider, use the link below to schedule a visit and establish care. When you choose a [facility] primary care physician or advanced practice provider, you gain a long-term partner in health. Find a Primary Care Provider  Learn more about [facility] in-office and virtual care options: [facility] - Get Care Now

## 2023-07-07 ENCOUNTER — Ambulatory Visit: Admitting: Family Medicine

## 2023-07-07 ENCOUNTER — Encounter: Payer: Self-pay | Admitting: Family Medicine

## 2023-07-07 VITALS — BP 110/70 | HR 88 | Temp 98.8°F | Ht 70.0 in | Wt 184.0 lb

## 2023-07-07 DIAGNOSIS — G43909 Migraine, unspecified, not intractable, without status migrainosus: Secondary | ICD-10-CM | POA: Diagnosis not present

## 2023-07-07 DIAGNOSIS — E559 Vitamin D deficiency, unspecified: Secondary | ICD-10-CM | POA: Diagnosis not present

## 2023-07-07 DIAGNOSIS — R6889 Other general symptoms and signs: Secondary | ICD-10-CM | POA: Diagnosis not present

## 2023-07-07 DIAGNOSIS — Z7689 Persons encountering health services in other specified circumstances: Secondary | ICD-10-CM | POA: Diagnosis not present

## 2023-07-07 MED ORDER — NURTEC 75 MG PO TBDP: 1.0000 | ORAL_TABLET | Freq: Every day | ORAL | 1 refills | Status: DC | PRN

## 2023-07-07 NOTE — Progress Notes (Signed)
 I,Jameka J Llittleton, CMA,acting as a Neurosurgeon for [employer], NP.,have documented all relevant documentation on the behalf of Ellender Hose, NP,as directed by  Ellender Hose, NP while in the presence of Ellender Hose, NP.  Subjective:  Patient ID: Mackenzie Reeves , female    DOB: November 25, 1985 , 38 y.o.   MRN: [id number]  Chief Complaint  Patient presents with   Establish Care   Headache    HPI  Patient is a 38 year old female who presents today to establish care. Patient states that she has chronic headaches that started approximately fifteen years ago, she states that she has seen multiple neurologists both in [place] and the [place]. She states that it has been diagnosed as migraine headaches, she has also had a couple of diagnostics e.g CT scan, EEG and at some point was get injections in her head but had to stop because she was pregnant. Right now, patient is not on any medication since after delivery , just ibuprofen/acetaminophen which gives her little or no relief. She states that the headaches are almost on a daily basis and she feels like she has a load on the center of her head. Patient also states that she noticed that she has become very forgetful recently . Discussed starting her on Quilipta 60 mg every day for migraine prevention and refer to neurology . She voiced understanding.      Past Medical History:  Diagnosis Date   Fibroid    Headache      Family History  Problem Relation Age of Onset   Headache Mother      Current Outpatient Medications:    Atogepant (QULIPTA) 60 MG TABS, Take 1 tablet (60 mg total) by mouth daily., Disp: 30 tablet, Rfl: 3   Rimegepant Sulfate (NURTEC) 75 MG TBDP, Take 1 tablet (75 mg total) by mouth daily as needed., Disp: 15 tablet, Rfl: 1   benzonatate (TESSALON) 100 MG capsule, Take 1 capsule (100 mg total) by mouth 3 (three) times daily as needed for cough. (Patient not taking: Reported on 07/07/2023), Disp: 30 capsule, Rfl: 0   folic acid (FOLVITE) 1 MG  tablet, Take 1 mg by mouth in the morning. (Patient not taking: Reported on 07/07/2023), Disp: , Rfl:    Allergies  Allergen Reactions   Nsaids Shortness Of Breath, Swelling and Rash    Facial swelling, difficulty breathing, rashes   Diclofenac Hives, Itching and Rash   Shrimp Extract Rash     Review of Systems  Constitutional: Negative.   HENT: Negative.    Eyes: Negative.   Respiratory: Negative.    Genitourinary: Negative.   Musculoskeletal: Negative.   Neurological:  Positive for headaches.     Today's Vitals   07/07/23 1418  BP: 110/70  Pulse: 88  Temp: 98.8 F (37.1 C)  TempSrc: Oral  Weight: 184 lb (83.5 kg)  Height: 5\' 10"  (1.778 m)  PainSc: 0-No pain   Body mass index is 26.4 kg/m.  Wt Readings from Last 3 Encounters:  07/07/23 184 lb (83.5 kg)  01/06/22 192 lb 8 oz (87.3 kg)  12/05/21 185 lb 6.4 oz (84.1 kg)    The ASCVD Risk score (Arnett DK, et al., 2019) failed to calculate for the following reasons:   The 2019 ASCVD risk score is only valid for ages 56 to 7  Objective:  Physical Exam HENT:     Head: Normocephalic.  Cardiovascular:     Rate and Rhythm: Normal rate.  Pulmonary:  Effort: Pulmonary effort is normal.  Skin:    General: Skin is warm and dry.  Neurological:     Mental Status: She is alert and oriented to person, place, and time.  Psychiatric:        Mood and Affect: Mood normal.         Assessment And Plan:  Establishing care with new doctor, encounter for -     CBC with Differential/Platelet -     CMP14+EGFR  Migraine without status migrainosus, not intractable, unspecified migraine type Assessment & Plan: Start Quilipta for migraine prophylaxis  Orders: -     Magnesium -     Nurtec; Take 1 tablet (75 mg total) by mouth daily as needed.  Dispense: 15 tablet; Refill: 1 -     Ambulatory referral to Neurology  Vitamin D deficiency Assessment & Plan: Check vitamin D levels  Orders: -     VITAMIN D 25 Hydroxy (Vit-D  Deficiency, Fractures)  Forgetfulness Assessment & Plan: Denies depression;Advised to write down tasks   Other orders -     Qulipta; Take 1 tablet (60 mg total) by mouth daily.  Dispense: 30 tablet; Refill: 3    Return in about 4 months (around 11/06/2023) for physical.  Patient was given opportunity to ask questions. Patient verbalized understanding of the plan and was able to repeat key elements of the plan. All questions were answered to their satisfaction.  I, Ellender Hose, NP, have reviewed all documentation for this visit. The documentation on 07/12/2023 for the exam, diagnosis, procedures, and orders are all accurate and complete.     IF YOU HAVE BEEN REFERRED TO A SPECIALIST, IT MAY TAKE 1-2 WEEKS TO SCHEDULE/PROCESS THE REFERRAL. IF YOU HAVE NOT HEARD FROM US/SPECIALIST IN TWO WEEKS, PLEASE GIVE [place] A CALL AT [phone].

## 2023-07-08 LAB — CBC WITH DIFFERENTIAL/PLATELET
Basophils Absolute: 0.1 10*3/uL (ref 0.0–0.2)
Basos: 1 %
EOS (ABSOLUTE): 0.1 10*3/uL (ref 0.0–0.4)
Eos: 1 %
Hematocrit: 37.5 % (ref 34.0–46.6)
Hemoglobin: 12 g/dL (ref 11.1–15.9)
Immature Grans (Abs): 0 10*3/uL (ref 0.0–0.1)
Immature Granulocytes: 0 %
Lymphocytes Absolute: 2.7 10*3/uL (ref 0.7–3.1)
Lymphs: 44 %
MCH: 26.4 pg — ABNORMAL LOW (ref 26.6–33.0)
MCHC: 32 g/dL (ref 31.5–35.7)
MCV: 83 fL (ref 79–97)
Monocytes Absolute: 0.4 10*3/uL (ref 0.1–0.9)
Monocytes: 6 %
Neutrophils Absolute: 3 10*3/uL (ref 1.4–7.0)
Neutrophils: 48 %
Platelets: 216 10*3/uL (ref 150–450)
RBC: 4.54 x10E6/uL (ref 3.77–5.28)
RDW: 15.8 % — ABNORMAL HIGH (ref 11.7–15.4)
WBC: 6.3 10*3/uL (ref 3.4–10.8)

## 2023-07-08 LAB — CMP14+EGFR
ALT: 17 IU/L (ref 0–32)
AST: 14 IU/L (ref 0–40)
Albumin: 4.5 g/dL (ref 3.9–4.9)
Alkaline Phosphatase: 80 IU/L (ref 44–121)
BUN/Creatinine Ratio: 14 (ref 9–23)
BUN: 9 mg/dL (ref 6–20)
Bilirubin Total: 0.8 mg/dL (ref 0.0–1.2)
CO2: 22 mmol/L (ref 20–29)
Calcium: 9.4 mg/dL (ref 8.7–10.2)
Chloride: 104 mmol/L (ref 96–106)
Creatinine, Ser: 0.65 mg/dL (ref 0.57–1.00)
Globulin, Total: 3 g/dL (ref 1.5–4.5)
Glucose: 80 mg/dL (ref 70–99)
Potassium: 4.2 mmol/L (ref 3.5–5.2)
Sodium: 141 mmol/L (ref 134–144)
Total Protein: 7.5 g/dL (ref 6.0–8.5)
eGFR: 115 mL/min/{1.73_m2} (ref >59)

## 2023-07-08 LAB — MAGNESIUM: Magnesium: 2.1 mg/dL (ref 1.6–2.3)

## 2023-07-08 LAB — VITAMIN D 25 HYDROXY (VIT D DEFICIENCY, FRACTURES): Vit D, 25-Hydroxy: 31 ng/mL (ref 30.0–100.0)

## 2023-07-12 ENCOUNTER — Encounter: Payer: Self-pay | Admitting: Family Medicine

## 2023-07-12 DIAGNOSIS — Z7689 Persons encountering health services in other specified circumstances: Secondary | ICD-10-CM | POA: Insufficient documentation

## 2023-07-12 DIAGNOSIS — R6889 Other general symptoms and signs: Secondary | ICD-10-CM | POA: Insufficient documentation

## 2023-07-12 MED ORDER — QULIPTA 60 MG PO TABS: 1.0000 | ORAL_TABLET | Freq: Every day | ORAL | 3 refills | Status: DC

## 2023-07-12 NOTE — Progress Notes (Signed)
 Labs are normal but you can still take a multivitamin to boost your VIT D levels.  Thanks

## 2023-07-12 NOTE — Assessment & Plan Note (Signed)
 Check vitamin D levels

## 2023-07-12 NOTE — Assessment & Plan Note (Signed)
 Start Quilipta for migraine prophylaxis

## 2023-07-12 NOTE — Assessment & Plan Note (Addendum)
 Denies depression;Advised to write down tasks

## 2023-08-11 DIAGNOSIS — R102 Pelvic and perineal pain: Secondary | ICD-10-CM | POA: Diagnosis not present

## 2023-08-11 DIAGNOSIS — Z1389 Encounter for screening for other disorder: Secondary | ICD-10-CM | POA: Diagnosis not present

## 2023-08-11 DIAGNOSIS — Z124 Encounter for screening for malignant neoplasm of cervix: Secondary | ICD-10-CM | POA: Diagnosis not present

## 2023-08-11 DIAGNOSIS — Z130 Encounter for screening for diseases of the blood and blood-forming organs and certain disorders involving the immune mechanism: Secondary | ICD-10-CM | POA: Diagnosis not present

## 2023-08-11 DIAGNOSIS — Z01411 Encounter for gynecological examination (general) (routine) with abnormal findings: Secondary | ICD-10-CM | POA: Diagnosis not present

## 2023-08-30 ENCOUNTER — Encounter: Payer: Self-pay | Admitting: Physical Therapy

## 2023-08-30 ENCOUNTER — Ambulatory Visit: Attending: Obstetrics and Gynecology | Admitting: Physical Therapy

## 2023-08-30 ENCOUNTER — Other Ambulatory Visit: Payer: Self-pay

## 2023-08-30 DIAGNOSIS — M6281 Muscle weakness (generalized): Secondary | ICD-10-CM | POA: Diagnosis not present

## 2023-08-30 DIAGNOSIS — R293 Abnormal posture: Secondary | ICD-10-CM | POA: Diagnosis not present

## 2023-08-30 DIAGNOSIS — M62838 Other muscle spasm: Secondary | ICD-10-CM | POA: Insufficient documentation

## 2023-08-30 DIAGNOSIS — R279 Unspecified lack of coordination: Secondary | ICD-10-CM | POA: Insufficient documentation

## 2023-08-30 NOTE — Patient Instructions (Signed)
 Mackenzie Reeves

## 2023-08-30 NOTE — Therapy (Unsigned)
 OUTPATIENT PHYSICAL THERAPY FEMALE PELVIC EVALUATION   Patient Name: Mackenzie Reeves MRN: [id number] DOB:04-Nov-1985, 38 y.o., female Today's Date: 08/30/2023  END OF SESSION:  PT End of Session - 08/30/23 1535     Visit Number 1    Date for PT Re-Evaluation 02/29/24    Authorization Type [place] MEDICAID HEALTHY BLUE    PT Start Time 1532    PT Stop Time 1608    PT Time Calculation (min) 36 min    Activity Tolerance Patient tolerated treatment well;No increased pain    Behavior During Therapy [facility] for tasks assessed/performed             Past Medical History:  Diagnosis Date   Fibroid    Headache    Past Surgical History:  Procedure Laterality Date   CESAREAN SECTION     CESAREAN SECTION N/A 01/06/2022   Procedure: CESAREAN SECTION;  Surgeon: Loa Riling, DO;  Location: [facility] LD ORS;  Service: Obstetrics;  Laterality: N/A;   MYOMECTOMY     Patient Active Problem List   Diagnosis Date Noted   Establishing care with new doctor, encounter for 07/12/2023   Forgetfulness 07/12/2023   Vitamin D  deficiency 07/07/2023   Acute blood loss anemia 01/08/2022   Postpartum care following cesarean delivery 01/06/2022   Supervision of other normal pregnancy, antepartum 07/16/2021   Migraines 07/16/2021   History of cesarean delivery 07/16/2021    PCP: Melodie Spry, NP   REFERRING PROVIDER: Loa Riling, DO   REFERRING DIAG: R10.2 (ICD-10-CM) - Pelvic and perineal pain  THERAPY DIAG:  Muscle weakness (generalized)  Abnormal posture  Unspecified lack of coordination  Other muscle spasm  Rationale for Evaluation and Treatment: Rehabilitation  ONSET DATE: 2017  SUBJECTIVE:                                                                                                                                                                                           SUBJECTIVE STATEMENT: Pain at lower abdomen and c-section scar with jumping/activities and low back worse  with lifting and bending forward   PAIN:  Are you having pain? Yes NPRS scale: 8/10 - back , 6/10 c-section  Pain location:  low back and c-section   Pain type: burning and sharp Pain description:  intermittent     Aggravating factors: lifting, jumping, carrying daughter, lying on stomach, bending forward  Relieving factors: resting   PRECAUTIONS: Other: postpartum (18 months)  RED FLAGS: None   WEIGHT BEARING RESTRICTIONS: No  FALLS:  Has patient fallen in last 6 months? No  OCCUPATION: IT - remote 3x weekly  ACTIVITY LEVEL :  low   PLOF: Independent  PATIENT GOALS: to have less pain; running; working out more  PERTINENT HISTORY:  CESAREAN SECTION x2,MYOMECTOMY,  Sexual abuse: No  BOWEL MOVEMENT: Pain with bowel movement: No Type of bowel movement:Type (Bristol Stool Scale) 4, Frequency daily, and Strain no Fully empty rectum: No Leakage: No Pads: No Fiber supplement/laxative No  URINATION: Pain with urination: No Fully empty bladder: Yes:   Stream: Strong Urgency: No Frequency: not nightly, no more than every 2 hours Leakage: none Pads: No  INTERCOURSE:  Ability to have vaginal penetration Yes  Pain with intercourse: none DrynessNo Climax: no pain Marinoff Scale: 0/3  PREGNANCY: Vaginal deliveries 0 C-section deliveries 2 Currently pregnant No  PROLAPSE: None   OBJECTIVE:  Note: Objective measures were completed at Evaluation unless otherwise noted.  DIAGNOSTIC FINDINGS:    PATIENT SURVEYS:  Oswestry Score: 17 / 50 or 34 %  COGNITION: Overall cognitive status: Within functional limits for tasks assessed     SENSATION: Light touch: Appears intact  LUMBAR SPECIAL TESTS:  Single leg stance test: unable to maintain due to poor pelvic stability and SI Compression/distraction test: Positive (increased pain)  FUNCTIONAL TESTS:  Functional squat test - decreased descent by 25%, reports worse back and abdominal pain, and need of hands at  thighs to return to standing  Sit-up test - 0/3 GAIT: Decreased bil step height, decreased stride length, poor trunk mobility  POSTURE: rounded shoulders, forward head, and posterior pelvic tilt   LUMBARAROM/PROM:  A/PROM A/PROM  eval  Flexion WFL - but slight increase in pain  Extension WFL  Right lateral flexion Limited by 50%  Left lateral flexion Limited by 50%  Right rotation Limited by 50%  Left rotation Limited by 50%   (Blank rows = not tested)  LOWER EXTREMITY ROM:  Bil hip IR and ER and hamstrings and adductors limited by 25%  LOWER EXTREMITY MMT:  Hips grossly 4+/5; knees 5/5 PALPATION:   General: tightness at bil thoracic and lumbar paraspinals and piriformis  Pelvic Alignment: WFL  Abdominal: TTP and great tightness throughout c-section scar but most restricted at Lt lateral and Lt mid scar quadrants; TTP throughout c-section scar and moderate fascial restrictions throughout lower abdominal quadrants with mild TTP as well                 External Perineal Exam: deferred                              Internal Pelvic Floor: deferred   Patient confirms identification and approves PT to assess internal pelvic floor and treatment No  PELVIC MMT:   MMT eval  Vaginal   Internal Anal Sphincter   External Anal Sphincter   Puborectalis   Diastasis Recti   (Blank rows = not tested)        TONE: Deferred   PROLAPSE: Deferred   TODAY'S TREATMENT:  DATE:   08/30/23 EVAL Examination completed, findings reviewed, pt educated on POC, scar massage. Pt motivated to participate in PT and agreeable to attempt recommendations.     PATIENT EDUCATION:  Education details: scar massage  Person educated: Patient Education method: Explanation, Demonstration, Tactile cues, Verbal cues, and Handouts Education comprehension: verbalized  understanding, returned demonstration, verbal cues required, tactile cues required, and needs further education  HOME EXERCISE PROGRAM: To be given  ASSESSMENT:  CLINICAL IMPRESSION: Patient is a 38 y.o. female  who was seen today for physical therapy evaluation and treatment for pelvic and back pain, poor c-section scar mobility. Pt reports her pain limits her mobility overall and returning to working out/being more active since having children. Pt's goal is to return to running and weight lifting without pain, at this time pt unable to do so and unable to standing for longer than 20 mins, carry 9 month old (23# daughter), walk longer than 0.5 miles, finish cooking a full meal, lifting, bending, squatting, and/or sit longer in relaxed position without pain increasing.  Pt found to have decreased flexibility in spine and hips, decreased core and hip strength,  profound tightness and poor mobility of c-section scar. Pt has had 2 c-sections without PT afterward. Pt also TTP at and around scar tissue. Pt reports mobility attempts here did cause pain and discomfort around low back as well. Pt demonstrated impaired posture, poor squatting mechanics, and unable to complete sit up without holding onto table, also unable to complete a single leg stance at all on either leg all indicative of poor hip and core strength. Due to pt's personal goals of returning to prior level of function and to complete needed tasks of day to day with child care pt would benefit from additional PT to address deficits and decreased risk of re injury.   OBJECTIVE IMPAIRMENTS: decreased activity tolerance, decreased coordination, decreased endurance, decreased mobility, difficulty walking, decreased ROM, decreased strength, increased fascial restrictions, increased muscle spasms, impaired flexibility, improper body mechanics, postural dysfunction, and pain.   ACTIVITY LIMITATIONS: carrying, lifting, bending, sitting, standing,  squatting, stairs, locomotion level, and caring for others  PARTICIPATION LIMITATIONS: meal prep, cleaning, laundry, shopping, community activity, and yard work  PERSONAL FACTORS: Fitness, Time since onset of injury/illness/exacerbation, and 1 comorbidity: x2 c-sections  are also affecting patient's functional outcome.   REHAB POTENTIAL: Good  CLINICAL DECISION MAKING: Stable/uncomplicated  EVALUATION COMPLEXITY: Low   GOALS: Goals reviewed with patient? Yes  SHORT TERM GOALS: Target date: 09/27/23  Pt to be I with HEP for carry over and continuing recommendations for improved outcomes.   Baseline:  Goal status: INITIAL  2.  Pt to demonstrate improved coordination of pelvic floor and breathing mechanics with 10# squat with appropriate synergistic patterns to decrease pain and leakage at least 75% of the time for improved ability to complete a 30 minute workout with strain at pelvic floor and symptoms.    Baseline: unable Goal status: INITIAL  3.  Pt to demonstrate I with coordination of transverse abdominis with co-contraction for strengthening exercises to improve pelvic stability and decreased strain at back to better lift daughter (23#).  Baseline: unable Goal status: INITIAL  4.  Pt to demonstrate improved lifting mechanics with minimal cues to improve ability to lift daughter without risk of injury.  Baseline: unable Goal status: INITIAL  5.  Pt to be I with scar self massage for improved c-section mobility for decreased strain at pelvis and low back for decreased pain levels.  Baseline: educated at eval Goal status: INITIAL   LONG TERM GOALS: Target date: 02/29/24  Pt to be I with advanced  HEP for carry over and continuing recommendations for improved outcomes.   Baseline: new Goal status: INITIAL  2.  Pt to demonstrate improved coordination of pelvic floor and breathing mechanics with 35# squat with appropriate synergistic patterns to decrease pain and leakage at  least 75% of the time for improved ability to complete a 30 minute workout with strain at pelvic floor and symptoms.    Baseline: unable Goal status: INITIAL  3.  Pt to report ability to walk for 1 hour without increase in pain for improved tolerance to return to running.  Baseline: unable Goal status: INITIAL  4.  Pt to demonstrate ability to complete x10 single leg squats without increased pain for improved pelvic stability to prepare to return to running.  Baseline: unable Goal status: INITIAL  5.  Pt to demonstrate x10 jumps in all directions without increased symptoms to improve tolerance to running/higher intensity activities with two small children without pain.  Baseline: unable Goal status: INITIAL  6.  Pt to report improved confidence with safety with returning to gym/working out with weights for improved ability to maintain strength post PT with knowledge and lifting mechanics learned in PT at least 75% of the time.  Baseline: unable Goal status: INITIAL  PLAN:  PT FREQUENCY: 1-2x/week (pt prefers 1x due to child care)  PT DURATION:  10 sessions  PLANNED INTERVENTIONS: 97110-Therapeutic exercises, 97530- Therapeutic activity, [id number]- Neuromuscular re-education, 97535- Self Care, [id number]- Manual therapy, [phone]- Canalith repositioning, [id number]- Aquatic Therapy, [phone]- Electrical stimulation (manual), [id number]- Vasopneumatic device, [id number]- Ultrasound, [id number]- Traction (mechanical), [id number]- Ionotophoresis 4mg /ml Dexamethasone , Patient/Family education, Taping, Dry Needling, Joint mobilization, Spinal mobilization, Scar mobilization, DME instructions, Cryotherapy, Moist heat, and Biofeedback  PLAN FOR NEXT SESSION: core and hip strengthening, scar massage   Avie Lemme, PT, DPT 04/29/258:12 AM

## 2023-09-15 DIAGNOSIS — D251 Intramural leiomyoma of uterus: Secondary | ICD-10-CM | POA: Diagnosis not present

## 2023-09-15 DIAGNOSIS — R102 Pelvic and perineal pain: Secondary | ICD-10-CM | POA: Diagnosis not present

## 2023-09-15 DIAGNOSIS — R109 Unspecified abdominal pain: Secondary | ICD-10-CM | POA: Diagnosis not present

## 2023-09-15 DIAGNOSIS — G8929 Other chronic pain: Secondary | ICD-10-CM | POA: Diagnosis not present

## 2023-09-15 DIAGNOSIS — M545 Low back pain, unspecified: Secondary | ICD-10-CM | POA: Diagnosis not present

## 2023-10-12 ENCOUNTER — Ambulatory Visit: Attending: Obstetrics and Gynecology | Admitting: Physical Therapy

## 2023-10-12 ENCOUNTER — Telehealth: Payer: Self-pay | Admitting: Physical Therapy

## 2023-10-12 DIAGNOSIS — R279 Unspecified lack of coordination: Secondary | ICD-10-CM | POA: Insufficient documentation

## 2023-10-12 DIAGNOSIS — M6281 Muscle weakness (generalized): Secondary | ICD-10-CM | POA: Insufficient documentation

## 2023-10-12 DIAGNOSIS — R293 Abnormal posture: Secondary | ICD-10-CM | POA: Insufficient documentation

## 2023-10-12 NOTE — Telephone Encounter (Signed)
 PT called pt about this morning's appointment at 0930. This is pt's first no show for PT at this clinic.  Appointment for 7/14 needs to be rescheduled as provider will not be in clinic.  Pt did not answer, mailbox full.   If able please call clinic at [phone] for appointment changes.   Thank you,  Avie Lemme, PT, DPT 10/11/2509:18 AM

## 2023-10-14 ENCOUNTER — Ambulatory Visit: Admitting: Physical Therapy

## 2023-10-14 DIAGNOSIS — R293 Abnormal posture: Secondary | ICD-10-CM

## 2023-10-14 DIAGNOSIS — R279 Unspecified lack of coordination: Secondary | ICD-10-CM

## 2023-10-14 DIAGNOSIS — M6281 Muscle weakness (generalized): Secondary | ICD-10-CM | POA: Diagnosis not present

## 2023-10-14 NOTE — Therapy (Addendum)
 OUTPATIENT PHYSICAL THERAPY FEMALE PELVIC TREATMENT   Patient Name: Mackenzie Reeves MRN: [id number] DOB:Jan 20, 1986, 38 y.o., female Today's Date: 10/14/2023  END OF SESSION:  PT End of Session - 10/14/23 1200     Visit Number 2    Date for PT Re-Evaluation 02/29/24    Authorization Type [place] MEDICAID HEALTHY BLUE    Authorization Time Period [employer] approved 6 visits 08/30/23-10/28/23 auth#[id number]    PT Start Time 1158   arrival   PT Stop Time 1239    PT Time Calculation (min) 41 min    Activity Tolerance Patient tolerated treatment well;No increased pain    Behavior During Therapy [facility] for tasks assessed/performed          Past Medical History:  Diagnosis Date   Fibroid    Headache    Past Surgical History:  Procedure Laterality Date   CESAREAN SECTION     CESAREAN SECTION N/A 01/06/2022   Procedure: CESAREAN SECTION;  Surgeon: Delana Ted Morrison, DO;  Location: [facility] LD ORS;  Service: Obstetrics;  Laterality: N/A;   MYOMECTOMY     Patient Active Problem List   Diagnosis Date Noted   Establishing care with new doctor, encounter for 07/12/2023   Forgetfulness 07/12/2023   Vitamin D  deficiency 07/07/2023   Acute blood loss anemia 01/08/2022   Postpartum care following cesarean delivery 01/06/2022   Supervision of other normal pregnancy, antepartum 07/16/2021   Migraines 07/16/2021   History of cesarean delivery 07/16/2021    PCP: Petrina Pries, NP   REFERRING PROVIDER: Delana Ted Morrison, DO   REFERRING DIAG: R10.2 (ICD-10-CM) - Pelvic and perineal pain  THERAPY DIAG:  Muscle weakness (generalized)  Abnormal posture  Unspecified lack of coordination  Rationale for Evaluation and Treatment: Rehabilitation  ONSET DATE: 2017  SUBJECTIVE:                                                                                                                                                                                           SUBJECTIVE STATEMENT: Pain at back  continues limiting in lying on belly and bending    PAIN:  Are you having pain? Yes NPRS scale: 9/10 - back , 3/10 c-section  Pain location: low back and c-section   Pain type: burning and sharp Pain description: intermittent    Aggravating factors: lifting, jumping, carrying daughter, lying on stomach, bending forward  Relieving factors: resting   PRECAUTIONS: Other: postpartum (18 months)  RED FLAGS: None   WEIGHT BEARING RESTRICTIONS: No  FALLS:  Has patient fallen in last 6 months? No  OCCUPATION: IT - remote 3x weekly  ACTIVITY LEVEL : low  PLOF: Independent  PATIENT GOALS: to have less pain; running; working out more  PERTINENT HISTORY:  CESAREAN SECTION x2,MYOMECTOMY,  Sexual abuse: No  BOWEL MOVEMENT: Pain with bowel movement: No Type of bowel movement:Type (Bristol Stool Scale) 4, Frequency daily, and Strain no Fully empty rectum: No Leakage: No Pads: No Fiber supplement/laxative No  URINATION: Pain with urination: No Fully empty bladder: Yes:   Stream: Strong Urgency: No Frequency: not nightly, no more than every 2 hours Leakage: none Pads: No  INTERCOURSE:  Ability to have vaginal penetration Yes  Pain with intercourse: none DrynessNo Climax: no pain Marinoff Scale: 0/3  PREGNANCY: Vaginal deliveries 0 C-section deliveries 2 Currently pregnant No  PROLAPSE: None   OBJECTIVE:  Note: Objective measures were completed at Evaluation unless otherwise noted.  DIAGNOSTIC FINDINGS:    PATIENT SURVEYS:  Oswestry Score: 17 / 50 or 34 %  COGNITION: Overall cognitive status: Within functional limits for tasks assessed     SENSATION: Light touch: Appears intact  LUMBAR SPECIAL TESTS:  Single leg stance test: unable to maintain due to poor pelvic stability and SI Compression/distraction test: Positive (increased pain)  FUNCTIONAL TESTS:  Functional squat test - decreased descent by 25%, reports worse back and abdominal pain, and  need of hands at thighs to return to standing  Sit-up test - 0/3 GAIT: Decreased bil step height, decreased stride length, poor trunk mobility  POSTURE: rounded shoulders, forward head, and posterior pelvic tilt   LUMBARAROM/PROM:  A/PROM A/PROM  eval  Flexion WFL - but slight increase in pain  Extension WFL  Right lateral flexion Limited by 50%  Left lateral flexion Limited by 50%  Right rotation Limited by 50%  Left rotation Limited by 50%   (Blank rows = not tested)  LOWER EXTREMITY ROM:  Bil hip IR and ER and hamstrings and adductors limited by 25%  LOWER EXTREMITY MMT:  Hips grossly 4+/5; knees 5/5 PALPATION:   General: tightness at bil thoracic and lumbar paraspinals and piriformis  Pelvic Alignment: WFL  Abdominal: TTP and great tightness throughout c-section scar but most restricted at Lt lateral and Lt mid scar quadrants; TTP throughout c-section scar and moderate fascial restrictions throughout lower abdominal quadrants with mild TTP as well                 External Perineal Exam: deferred                              Internal Pelvic Floor: deferred   Patient confirms identification and approves PT to assess internal pelvic floor and treatment No  PELVIC MMT:   MMT eval  Vaginal   Internal Anal Sphincter   External Anal Sphincter   Puborectalis   Diastasis Recti   (Blank rows = not tested)        TONE: Deferred   PROLAPSE: Deferred   TODAY'S TREATMENT:  DATE:   08/30/23 EVAL Examination completed, findings reviewed, pt educated on POC, scar massage. Pt motivated to participate in PT and agreeable to attempt recommendations.    10/14/23: Nustep 5 mins L5 and cues for posture PT present for progress Lumbar roll outs rt/lt/center x10 each Half foam roll thoracic extension wide claps x15 Cat cow quad attempted but pt unable  to complete correctly>seated with improved technique x15  Quad needle threaders x10 5s holds on each 2x10 transverse abdominis activations - unable to complete but with use of ball press between hands greatly improved 2x10 transverse abdominis with marching  PATIENT EDUCATION:  Education details: scar massage, [id number]  Person educated: Patient Education method: Explanation, Demonstration, Tactile cues, Verbal cues, and Handouts Education comprehension: verbalized understanding, returned demonstration, verbal cues required, tactile cues required, and needs further education  HOME EXERCISE PROGRAM: [id number]  ASSESSMENT:  CLINICAL IMPRESSION: Patient is a 38 y.o. female  who was seen today for physical therapy treatment, pt limited today due to arriving to appointment late but tolerated well with stretching for decreased back pain. Pt tolerated well but needed to modified some mobility at pain with Rt arm but reports she is planning to see MD about this. Pt benefits from cues for techniques, relaxation and transverse abdominis activations. 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   OBJECTIVE IMPAIRMENTS: decreased activity tolerance, decreased coordination, decreased endurance, decreased mobility, difficulty walking, decreased ROM, decreased strength, increased fascial restrictions, increased muscle spasms, impaired flexibility, improper body mechanics, postural dysfunction, and pain.   ACTIVITY LIMITATIONS: carrying, lifting, bending, sitting, standing, squatting, stairs, locomotion level, and caring for others  PARTICIPATION LIMITATIONS: meal prep, cleaning, laundry, shopping, community activity, and yard work  PERSONAL FACTORS: Fitness, Time since onset of injury/illness/exacerbation, and 1 comorbidity: x2 c-sections  are also affecting patient's functional outcome.   REHAB POTENTIAL: Good  CLINICAL DECISION MAKING: Stable/uncomplicated  EVALUATION COMPLEXITY: Low   GOALS: Goals reviewed with patient? Yes  SHORT TERM GOALS: Target date: 09/27/23  Pt to be I with HEP for carry over and continuing recommendations for improved outcomes.   Baseline:  Goal status: INITIAL  2.  Pt to demonstrate improved coordination of pelvic floor and breathing mechanics with 10# squat with appropriate synergistic patterns to decrease pain and leakage at least 75% of the time for improved ability to complete a 30 minute workout with strain at pelvic floor and symptoms.    Baseline: unable Goal status: INITIAL  3.  Pt to demonstrate I with coordination of transverse abdominis with co-contraction for strengthening exercises to improve pelvic stability and decreased strain at back to better lift daughter (23#).  Baseline: unable Goal status: INITIAL  4.  Pt to demonstrate improved lifting mechanics with minimal cues to improve ability to lift daughter without risk of injury.  Baseline: unable Goal status: INITIAL  5.  Pt to be I with scar self massage for improved c-section mobility for decreased strain at pelvis and low back for decreased pain levels.  Baseline: educated at eval Goal status: INITIAL   LONG TERM GOALS: Target date: 02/29/24  Pt to be I with advanced  HEP for carry over and continuing recommendations for improved outcomes.   Baseline: new Goal status: INITIAL  2.  Pt to demonstrate improved coordination of pelvic floor and breathing mechanics  with 35# squat with appropriate synergistic patterns to decrease pain and leakage at least 75% of the time for improved ability to complete a 30 minute workout with strain at pelvic floor and symptoms.    Baseline: unable Goal status: INITIAL  3.  Pt to report ability to walk for 1 hour without increase in pain for improved tolerance to return to running.   Baseline: unable Goal status: INITIAL  4.  Pt to demonstrate ability to complete x10 single leg squats without increased pain for improved pelvic stability to prepare to return to running.  Baseline: unable Goal status: INITIAL  5.  Pt to demonstrate x10 jumps in all directions without increased symptoms to improve tolerance to running/higher intensity activities with two small children without pain.  Baseline: unable Goal status: INITIAL  6.  Pt to report improved confidence with safety with returning to gym/working out with weights for improved ability to maintain strength post PT with knowledge and lifting mechanics learned in PT at least 75% of the time.  Baseline: unable Goal status: INITIAL  PLAN:  PT FREQUENCY: 1-2x/week (pt prefers 1x due to child care)  PT DURATION: 10 sessions  PLANNED INTERVENTIONS: 97110-Therapeutic exercises, 97530- Therapeutic activity, [id number]- Neuromuscular re-education, 97535- Self Care, [id number]- Manual therapy, [phone]- Canalith repositioning, [id number]- Aquatic Therapy, [phone]- Electrical stimulation (manual), [id number]- Vasopneumatic device, [id number]- Ultrasound, [id number]- Traction (mechanical), [id number]- Ionotophoresis 4mg /ml Dexamethasone , Patient/Family education, Taping, Dry Needling, Joint mobilization, Spinal mobilization, Scar mobilization, DME instructions, Cryotherapy, Moist heat, and Biofeedback  PLAN FOR NEXT SESSION: core and hip strengthening, scar massage   Darryle Navy, PT, DPT 10/14/2510:50 PM   PHYSICAL THERAPY DISCHARGE SUMMARY  Visits from Start of Care: 2  Current functional level related to goals / functional outcomes: Unable to reassess as pt not returning since last visit    Remaining deficits: Unable to reassess    Education / Equipment: HEP   Patient agrees to discharge. Patient goals were partially met. Patient is being discharged due to not returning since the last visit.   Darryle Navy, PT, DPT 07/29/258:43 AM  [facility] [address], [address] [place], [place] [address] Phone # [phone] Fax [phone]

## 2023-10-22 ENCOUNTER — Encounter: Payer: Self-pay | Admitting: Diagnostic Neuroimaging

## 2023-10-22 ENCOUNTER — Ambulatory Visit: Admitting: Diagnostic Neuroimaging

## 2023-10-22 VITALS — BP 99/63 | HR 66 | Ht 64.5 in | Wt 183.0 lb

## 2023-10-22 DIAGNOSIS — G43E01 Chronic migraine with aura, not intractable, with status migrainosus: Secondary | ICD-10-CM

## 2023-10-22 DIAGNOSIS — F419 Anxiety disorder, unspecified: Secondary | ICD-10-CM

## 2023-10-22 MED ORDER — GABAPENTIN 100 MG PO CAPS: 100.0000 mg | ORAL_CAPSULE | Freq: Two times a day (BID) | ORAL | 3 refills | Status: DC

## 2023-10-22 MED ORDER — SERTRALINE HCL 25 MG PO TABS: 25.0000 mg | ORAL_TABLET | Freq: Every day | ORAL | 3 refills | Status: DC

## 2023-10-22 NOTE — Progress Notes (Signed)
 [facility]  PATIENT: Mackenzie Reeves DOB: [birth year]  REFERRING CLINICIAN: Melodie Spry, NP HISTORY FROM: patient  REASON FOR VISIT: new consult   HISTORICAL  CHIEF COMPLAINT:  Chief Complaint  Patient presents with   RM 7    Patient is here with daughter for referral for migraines. She has headaches and other uncomfortable feelings inside her head. Feels forgetful. Has feeling of movement and heaviness, a burning sensation inside at the top of her head. Tylenol  doesn't help much other than slight help with the headache. She has bad headaches once in awhile with blurred vision, eye pain, sometimes she's had episodes where she can't see, feels dizzy.  Everyday she has the other movement feelings in her head.     HISTORY OF PRESENT ILLNESS:   38 year old female here for evaluation of abnormal sensation / headaches.  Age 75 years old patient was living in [place], with her aunt while she was attending school.  She had some argument with her aunt and had to move out.  Apparently her aunt was giving her a hard time and this resulted in patient having to move back with her father.  Around this time patient also had onset of abnormal sensation in the head, blurred vision, loss of visions, itching sensation in head, racing thoughts.  She saw a neurologist and was diagnosed with migraine.  At some point patient was also tried on Keppra and Tegretol because of an abnormal EEG in [place], but follow-up EEG in the [place]  in 2023 was normal and therefore these medications were discontinued.  Symptoms have continued on a daily basis since that time.  Patient ultimately attended [employer] but had to take 2 years off due to difficulty with concentration and ongoing headaches.  Eventually patient moved to the [place]  to attend masters degree program which she has completed.  She has been able to complete her work but with great difficulty.  Now she is in the process of applying  for jobs but having difficulty during interviews with focus and concentration due to her abnormal sensations in the head.  Patient continues to feel itching, moving, burning and uncomfortable sensation in the scalp and head.  [facility] and had trigger point injections without relief.  She also saw headache specialist Dr. Billy Bue, but patient was pregnant at the time and was not able to try that any migraine medications.  Patient does recall trying topiramate, amitriptyline in the past without relief.  More recently has tried Qulipta  and Nurtec without relief.  Still having ongoing racing thoughts and anxiety.  She also feels depression due to her ongoing headache and abnormal sensations in the head.  Patient lives with her 67-year-old daughter.  She does not have any other family in the [place] .   REVIEW OF SYSTEMS: Full 14 system review of systems performed and negative with exception of: as per HPI.  ALLERGIES: Allergies  Allergen Reactions   Nsaids Shortness Of Breath, Swelling and Rash    Facial swelling, difficulty breathing, rashes   Diclofenac Hives, Itching and Rash   Shrimp Extract Rash    HOME MEDICATIONS: Outpatient Medications Prior to Visit  Medication Sig Dispense Refill   acetaminophen  (TYLENOL ) 325 MG tablet Take 650 mg by mouth as needed for headache.     Atogepant  (QULIPTA ) 60 MG TABS Take 1 tablet (60 mg total) by mouth daily. (Patient not taking: Reported on 10/22/2023) 30 tablet 3   benzonatate  (TESSALON ) 100  MG capsule Take 1 capsule (100 mg total) by mouth 3 (three) times daily as needed for cough. (Patient not taking: Reported on 10/22/2023) 30 capsule 0   folic acid (FOLVITE) 1 MG tablet Take 1 mg by mouth in the morning. (Patient not taking: Reported on 10/22/2023)     Rimegepant Sulfate (NURTEC) 75 MG TBDP Take 1 tablet (75 mg total) by mouth daily as needed. (Patient not taking: Reported on 10/22/2023) 15 tablet 1   No  facility-administered medications prior to visit.    PAST MEDICAL HISTORY: Past Medical History:  Diagnosis Date   Fibroid    Headache     PAST SURGICAL HISTORY: Past Surgical History:  Procedure Laterality Date   CESAREAN SECTION     CESAREAN SECTION N/A 01/06/2022   Procedure: CESAREAN SECTION;  Surgeon: Loa Riling, DO;  Location: [facility] LD [facility];  Service: Obstetrics;  Laterality: N/A;   MYOMECTOMY      FAMILY HISTORY: Family History  Problem Relation Age of Onset   Headache Mother    Headache Brother     SOCIAL HISTORY: Social History   Socioeconomic History   Marital status: Legally Separated    Spouse name: Not on file   Number of children: 2   Years of education: Not on file   Highest education level: Not on file  Occupational History   Not on file  Tobacco Use   Smoking status: Never    Passive exposure: Never   Smokeless tobacco: Never  Vaping Use   Vaping status: Never Used  Substance and Sexual Activity   Alcohol use: Never   Drug use: Never   Sexual activity: Yes    Birth control/protection: None  Other Topics Concern   Not on file  Social History Narrative   Lives w son and daughter   R handed   Caffeine: none   Social Drivers of Corporate investment banker Strain: Not on file  Food Insecurity: Not on file  Transportation Needs: Not on file  Physical Activity: Not on file  Stress: Not on file  Social Connections: Unknown (09/02/2021)   [facility] from [facility]   Social Network    Social Network: Not on file  Intimate Partner Violence: Unknown (08/27/2021)   Received from [facility]   HITS    Physically Hurt: Not on file    Insult or Talk Down To: Not on file    Threaten Physical Harm: Not on file    Scream or Curse: Not on file     PHYSICAL EXAM  GENERAL EXAM/CONSTITUTIONAL: Vitals:  Vitals:   10/22/23 0843  BP: 99/63  Pulse: 66  Weight: 183 lb (83 kg)  Height: 5' 4.5 (1.638 m)   Body mass index is 30.93  kg/m. Wt Readings from Last 3 Encounters:  10/22/23 183 lb (83 kg)  07/07/23 184 lb (83.5 kg)  01/06/22 192 lb 8 oz (87.3 kg)   Patient is in no distress; well developed, nourished and groomed; neck is supple  CARDIOVASCULAR: Examination of carotid arteries is normal; no carotid bruits Regular rate and rhythm, no murmurs Examination of peripheral vascular system by observation and palpation is normal  EYES: Ophthalmoscopic exam of optic discs and posterior segments is normal; no papilledema or hemorrhages No results found.  MUSCULOSKELETAL: Gait, strength, tone, movements noted in Neurologic exam below  NEUROLOGIC: MENTAL STATUS:      No data to display         awake, alert, oriented to person,  place and time recent and remote memory intact normal attention and concentration language fluent, comprehension intact, naming intact fund of knowledge appropriate  CRANIAL NERVE:  2nd - no papilledema on fundoscopic exam 2nd, 3rd, 4th, 6th - pupils equal and reactive to light, visual fields full to confrontation, extraocular muscles intact, no nystagmus 5th - facial sensation symmetric 7th - facial strength symmetric 8th - hearing intact 9th - palate elevates symmetrically, uvula midline 11th - shoulder shrug symmetric 12th - tongue protrusion midline  MOTOR:  normal bulk and tone, full strength in the BUE, BLE  SENSORY:  normal and symmetric to light touch, temperature, vibration  COORDINATION:  finger-nose-finger, fine finger movements normal  REFLEXES:  deep tendon reflexes 1+ and symmetric  GAIT/STATION:  narrow based gait     DIAGNOSTIC DATA (LABS, IMAGING, TESTING) - I reviewed patient records, labs, notes, testing and imaging myself where available.  Lab Results  Component Value Date   WBC 6.3 07/07/2023   HGB 12.0 07/07/2023   HCT 37.5 07/07/2023   MCV 83 07/07/2023   PLT 216 07/07/2023      Component Value Date/Time   NA 141 07/07/2023 1509    K 4.2 07/07/2023 1509   CL 104 07/07/2023 1509   CO2 22 07/07/2023 1509   GLUCOSE 80 07/07/2023 1509   BUN 9 07/07/2023 1509   CREATININE 0.65 07/07/2023 1509   CALCIUM 9.4 07/07/2023 1509   PROT 7.5 07/07/2023 1509   ALBUMIN 4.5 07/07/2023 1509   AST 14 07/07/2023 1509   ALT 17 07/07/2023 1509   ALKPHOS 80 07/07/2023 1509   BILITOT 0.8 07/07/2023 1509   No results found for: CHOL, HDL, LDLCALC, LDLDIRECT, TRIG, CHOLHDL No results found for: RJJO8C No results found for: VITAMINB12 No results found for: TSH  09/24/21 MRI brain 1.  No acute intracranial abnormality.   09/12/21 MRV head - Unremarkable MRV of the head.    ASSESSMENT AND PLAN  38 y.o. year old female here with:  Meds tried: topiramate, amitriptyline, tramadol , nurtec, qulipta   Dx:  1. 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 Anxiety     PLAN:  ABNORMAL HEAD / SCALP SENSATIONS --> CHRONIC MIGRAINE + ANXIETY -Decrease or avoid caffeine / alcohol -Eat and sleep on a regular schedule -Exercise several times per week - trial of sertraline 25mg  at bedtime - trial of gabapentin  100mg  twice a day   MIGRAINE RESCUE  - ibuprofen, tylenol  as needed  ANXIETY - refer to psychology evaluation  Meds ordered this encounter  Medications   gabapentin  (NEURONTIN ) 100 MG capsule    Sig: Take 1 capsule (100 mg total) by mouth in the morning and at bedtime.    Dispense:  60 capsule    Refill:  3   sertraline (ZOLOFT) 25 MG tablet    Sig: Take 1 tablet (25 mg total) by mouth daily.    Dispense:  30 tablet    Refill:  3   Orders Placed This Encounter  Procedures   Ambulatory referral to Psychology    Meds ordered this encounter  Medications   gabapentin  (NEURONTIN ) 100 MG capsule    Sig: Take 1 capsule (100 mg total) by mouth in the morning and at bedtime.    Dispense:  60 capsule    Refill:  3   sertraline (ZOLOFT) 25 MG tablet    Sig: Take 1  tablet (25 mg total) by mouth daily.    Dispense:  30 tablet  Refill:  3    Return in about 4 months (around 02/21/2024) for MyChart visit (15 min).    Omega Bible, MD 10/22/2023, 9:44 AM Certified in Neurology, Neurophysiology and Neuroimaging  [facility] [address] [place], [place] [address] [phone]

## 2023-10-22 NOTE — Patient Instructions (Addendum)
 ABNORMAL HEAD / SCALP SENSATIONS --> CHRONIC MIGRAINE + ANXIETY -Decrease or avoid caffeine / alcohol -Eat and sleep on a regular schedule -Exercise several times per week - trial of sertraline 25mg  at bedtime - trial of gabapentin  100mg  twice a day   MIGRAINE RESCUE  - ibuprofen, tylenol  as needed  ANXIETY - refer to psychology evaluation

## 2023-10-25 ENCOUNTER — Telehealth: Payer: Self-pay | Admitting: Diagnostic Neuroimaging

## 2023-10-25 ENCOUNTER — Encounter: Payer: Self-pay | Admitting: Diagnostic Neuroimaging

## 2023-10-25 NOTE — Telephone Encounter (Signed)
 Spoke w/Pt who stated she has had trouble sleeping Sat and Sun night since taking the gabapentin  and sertraline . Pt stated she has not had caffeine or alcohol. Denies changes in diet or sleep habits, states no new stressors currently. Pt is tired since having little to no sleep for past 2 days. Informed Pt will send message to provider for possible recommendations. Pt voiced understanding and thanks for the call.

## 2023-10-25 NOTE — Telephone Encounter (Signed)
 Spoke w/Pt to make her aware of Dr. Chancy recommendation to take sertraline  25 mg in AM and stop taking gabapentin . Pt voiced understanding and thanks for the call.

## 2023-10-25 NOTE — Telephone Encounter (Signed)
 Referral for psychology fax to [facility]. Phone: [phone]. Fax: [phone]

## 2023-10-25 NOTE — Telephone Encounter (Signed)
 Pt called in regards  to not been sleep for 2 days . Pt  states she is not okay because she has not sleep  Pt is only taking medication that was prescribe to her  i. Pt don't feel medication  working . Pt request to speak to MD?

## 2023-10-26 ENCOUNTER — Telehealth: Payer: Self-pay | Admitting: Diagnostic Neuroimaging

## 2023-10-26 NOTE — Telephone Encounter (Signed)
 Pt called in regards  to still not able to sleep the patient states that she sent Mychart message  as well . Pt would like to speak to nurse or MD

## 2023-10-27 NOTE — Telephone Encounter (Signed)
 See mychart.

## 2023-10-27 NOTE — Telephone Encounter (Signed)
 Spoke with patient. She states she had initially only taken Gabapentin  once a day. She stopped both Gabapentin  and Sertraline  yesterday and was able to sleep 2 hours last night so she felt a little better when she woke up. She is thinking about taking Sertraline  again tonight. She is unable to identify which med helped but she did have some improvement in the head sensations she has been feeling. She could not go to work yesterday and she worked from home today. She is asking if she could have a statement from work that mentions she had issues with these medications. She said before she started the medications her sleep was fine. I informed pt I would d/w Dr Margaret and call her back. She was appreciative.

## 2023-10-28 NOTE — Telephone Encounter (Signed)
 I called the patient and discussed Dr Chancy message. Pt verbalized understanding.  She said she may try one of the medications again. Last night she only took Melatonin and was able to sleep.

## 2023-11-08 ENCOUNTER — Ambulatory Visit: Attending: Obstetrics and Gynecology | Admitting: Physical Therapy

## 2023-11-08 DIAGNOSIS — R279 Unspecified lack of coordination: Secondary | ICD-10-CM | POA: Insufficient documentation

## 2023-11-08 DIAGNOSIS — M6281 Muscle weakness (generalized): Secondary | ICD-10-CM | POA: Insufficient documentation

## 2023-11-08 DIAGNOSIS — R293 Abnormal posture: Secondary | ICD-10-CM | POA: Insufficient documentation

## 2023-11-15 ENCOUNTER — Encounter: Admitting: Physical Therapy

## 2023-11-16 ENCOUNTER — Ambulatory Visit: Admitting: Physical Therapy

## 2023-11-17 ENCOUNTER — Encounter: Admitting: Family Medicine

## 2023-11-22 ENCOUNTER — Ambulatory Visit: Admitting: Physical Therapy

## 2023-11-24 ENCOUNTER — Telehealth: Payer: Self-pay | Admitting: Physical Therapy

## 2023-11-24 NOTE — Telephone Encounter (Signed)
 PT called pt about appointment 11/22/23 at 1015. Pt did not answer, voicemail left.  PT explained office's attendance policy and for pt to please call clinic back if she has questions.   Darryle Navy, PT, DPT 11/23/2509:39 AM  [facility] [address] [place], [place] [address] Phone # [phone] Fax [phone]

## 2023-11-29 ENCOUNTER — Ambulatory Visit: Admitting: Physical Therapy

## 2023-11-30 ENCOUNTER — Telehealth: Payer: Self-pay | Admitting: Physical Therapy

## 2023-11-30 NOTE — Telephone Encounter (Signed)
 PT called pt about appointment 11/29/23, spoke with pt about missed appointments and due to attendance policy pt will be discharged from PT.   Darryle Navy, PT, DPT 07/29/258:43 AM  [facility] [address], [address] [place], [place] [address] Phone # [phone] Fax [phone]

## 2023-12-06 ENCOUNTER — Encounter: Admitting: Physical Therapy

## 2023-12-13 ENCOUNTER — Encounter: Admitting: Physical Therapy

## 2023-12-20 ENCOUNTER — Encounter: Admitting: Physical Therapy

## 2023-12-27 ENCOUNTER — Encounter: Admitting: Physical Therapy

## 2024-02-24 ENCOUNTER — Other Ambulatory Visit: Payer: Self-pay

## 2024-02-24 ENCOUNTER — Ambulatory Visit (HOSPITAL_COMMUNITY)
Admission: EM | Admit: 2024-02-24 | Discharge: 2024-02-24 | Disposition: A | Discharge status: Home / Self Care | Payer: Self-pay | Attending: Family Medicine | Admitting: Family Medicine

## 2024-02-24 ENCOUNTER — Encounter (HOSPITAL_COMMUNITY): Payer: Self-pay | Admitting: Emergency Medicine

## 2024-02-24 DIAGNOSIS — J0100 Acute maxillary sinusitis, unspecified: Secondary | ICD-10-CM

## 2024-02-24 DIAGNOSIS — R051 Acute cough: Secondary | ICD-10-CM

## 2024-02-24 MED ORDER — PROMETHAZINE-DM 6.25-15 MG/5ML PO SYRP: 5.0000 mL | ORAL_SOLUTION | Freq: Four times a day (QID) | ORAL | 0 refills | Status: DC | PRN

## 2024-02-24 MED ORDER — AMOXICILLIN-POT CLAVULANATE 875-125 MG PO TABS: 1.0000 | ORAL_TABLET | Freq: Two times a day (BID) | ORAL | 0 refills | Status: DC

## 2024-02-24 MED ORDER — FLUTICASONE PROPIONATE 50 MCG/ACT NA SUSP: 1.0000 | Freq: Every day | NASAL | 2 refills | Status: DC

## 2024-02-24 NOTE — ED Provider Notes (Signed)
 [facility]   [id number] 02/24/24 Arrival Time: 0903  ASSESSMENT & PLAN:  1. Acute non-recurrent maxillary sinusitis   2. Acute cough     Meds ordered this encounter  Medications   fluticasone (FLONASE) 50 MCG/ACT nasal spray    Sig: Place 1 spray into both nostrils daily.    Dispense:  16 g    Refill:  2   amoxicillin -clavulanate (AUGMENTIN) 875-125 MG tablet    Sig: Take 1 tablet by mouth every 12 (twelve) hours.    Dispense:  14 tablet    Refill:  0   promethazine -dextromethorphan (PROMETHAZINE -DM) 6.25-15 MG/5ML syrup    Sig: Take 5 mLs by mouth 4 (four) times daily as needed for cough.    Dispense:  118 mL    Refill:  0   Work note provided. Discussed typical duration of symptoms. OTC symptom care as needed. Ensure adequate fluid intake and rest.   Follow-up Information     Petrina Pries, NP.   Specialty: Family Medicine Why: As needed. Contact information: [address] [place] [address] [phone]         [facility] [place].   Specialty: Urgent Care Why: For wound re-check. Contact information: [address] [place]  [address] [phone]                Reviewed expectations re: course of current medical issues. Questions answered. Outlined signs and symptoms indicating need for more acute intervention. Patient verbalized understanding. After Visit Summary given.   SUBJECTIVE: History from: patient.  Mackenzie Reeves is a 38 y.o. female who presents with complaint of nasal congestion, post-nasal drainage, and sinus pain. Onset gradual, a week ago. Respiratory symptoms: cough. Fever: absent. Overall normal PO intake without n/v. Also with nasal itching. Social History   Tobacco Use  Smoking Status Never   Passive exposure: Never  Smokeless Tobacco Never     OBJECTIVE:  Vitals:   02/24/24 0939  BP: 109/74  Pulse: 67  Resp: 20  Temp: 98.2 F (36.8 C)   TempSrc: Oral  SpO2: 99%     General appearance: alert; no distress HEENT: nasal congestion; clear runny nose; throat irritation secondary to post-nasal drainage; bilateral maxillary tenderness to palpation; turbinates boggy Neck: supple without LAD; trachea midline Lungs: unlabored respirations, symmetrical air entry; cough: mild; no respiratory distress Skin: warm and dry Psychological: alert and cooperative; normal mood and affect  Allergies  Allergen Reactions   Nsaids Shortness Of Breath, Swelling and Rash    Facial swelling, difficulty breathing, rashes   Diclofenac Hives, Itching and Rash   Shrimp Extract Rash    Past Medical History:  Diagnosis Date   Fibroid    Headache    Family History  Problem Relation Age of Onset   Headache Mother    Headache Brother    Social History   Socioeconomic History   Marital status: Legally Separated    Spouse name: Not on file   Number of children: 2   Years of education: Not on file   Highest education level: Not on file  Occupational History   Not on file  Tobacco Use   Smoking status: Never    Passive exposure: Never   Smokeless tobacco: Never  Vaping Use   Vaping status: Never Used  Substance and Sexual Activity   Alcohol use: Never   Drug use: Never   Sexual activity: Yes    Birth control/protection: None  Other Topics Concern  Not on file  Social History Narrative   Lives w son and daughter   R handed   Caffeine: none   Social Drivers of Corporate investment banker Strain: Not on file  Food Insecurity: Not on file  Transportation Needs: Not on file  Physical Activity: Not on file  Stress: Not on file  Social Connections: Unknown (09/02/2021)   [facility]   Social Network    Social Network: Not on file  Intimate Partner Violence: Unknown (08/27/2021)   Received from [facility]   HITS    Physically Hurt: Not on file    Insult or Talk Down To: Not on file    Threaten Physical  Harm: Not on file    Scream or Curse: Not on file             Rolinda Rogue, MD 02/24/24 1002

## 2024-02-24 NOTE — ED Triage Notes (Signed)
 Symptoms started last week.  Patient has a cough, sneezing, brown, green-brown phlegm. Complains of pain in center face especially with sneezing,   Denies a fever.  Has not taken any medications  patient has complaints of headache

## 2024-02-28 ENCOUNTER — Encounter: Payer: Self-pay | Admitting: Diagnostic Neuroimaging

## 2024-02-28 ENCOUNTER — Ambulatory Visit: Admitting: Diagnostic Neuroimaging

## 2024-03-04 ENCOUNTER — Telehealth: Payer: Self-pay | Admitting: Emergency Medicine

## 2024-03-04 DIAGNOSIS — G43909 Migraine, unspecified, not intractable, without status migrainosus: Secondary | ICD-10-CM

## 2024-03-04 NOTE — Progress Notes (Signed)
 Virtual Visit Consent   Mackenzie Reeves, you are scheduled for a virtual visit with a [facility] provider today. Just as with appointments in the office, your consent must be obtained to participate. Your consent will be active for this visit and any virtual visit you may have with one of our providers in the next 365 days. If you have a MyChart account, a copy of this consent can be sent to you electronically.  As this is a virtual visit, video technology does not allow for your provider to perform a traditional examination. This may limit your provider's ability to fully assess your condition. If your provider identifies any concerns that need to be evaluated in person or the need to arrange testing (such as labs, EKG, etc.), we will make arrangements to do so. Although advances in technology are sophisticated, we cannot ensure that it will always work on either your end or our end. If the connection with a video visit is poor, the visit may have to be switched to a telephone visit. With either a video or telephone visit, we are not always able to ensure that we have a secure connection.  By engaging in this virtual visit, you consent to the provision of healthcare and authorize for your insurance to be billed (if applicable) for the services provided during this visit. Depending on your insurance coverage, you may receive a charge related to this service.  I need to obtain your verbal consent now. Are you willing to proceed with your visit today? Lorain Bursch has provided verbal consent on 03/04/2024 for a virtual visit (video or telephone). Jon CHRISTELLA Belt, NP  Date: 03/04/2024 3:13 PM   Virtual Visit via Video Note   I, Jon CHRISTELLA Belt, connected with  Srija Southard  ([id number], 1985-05-23) on 03/04/24 at  3:00 PM EDT by a video-enabled telemedicine application and verified that I am speaking with the correct person using two identifiers.  Location: Patient: Virtual Visit Location Patient:  Home Provider: Virtual Visit Location Provider: Home Office   I discussed the limitations of evaluation and management by telemedicine and the availability of in person appointments. The patient expressed understanding and agreed to proceed.    History of Present Illness: Mackenzie Reeves is a 38 y.o. who identifies as a female who was assigned female at birth, and is being seen today for movement sensation in scalp, head feels heavy, eyes are sensitive to sunlight. Dizzy feeling sometimes. Sometimes scalp is burning, itching. These sx are ongoing for years.   Met with neurologist, started on 2 medicines, they are not helping and they were keeping her from sleeping so she has stopped taking them.    This week sx are worse, and she cannot concentrate.   Taking tylenol  for headaches but it doesn't help with scalp sensations.   Does not feel Dr. Margaret helped her. Would like to see different neurologist, wants referral.   [facility]'s note from 10/2023 indicate normal EEG and brain MRI previously. STarted on sertraline  and gabapentin    HPI: HPI  Problems:  Patient Active Problem List   Diagnosis Date Noted   Establishing care with new doctor, encounter for 07/12/2023   Forgetfulness 07/12/2023   Vitamin D  deficiency 07/07/2023   Acute blood loss anemia 01/08/2022   Postpartum care following cesarean delivery 01/06/2022   Supervision of other normal pregnancy, antepartum 07/16/2021   Migraines 07/16/2021   History of cesarean delivery 07/16/2021    Allergies:  Allergies  Allergen Reactions   Nsaids  Shortness Of Breath, Swelling and Rash    Facial swelling, difficulty breathing, rashes   Diclofenac Hives, Itching and Rash   Shrimp Extract Rash   Medications:  Current Outpatient Medications:    acetaminophen  (TYLENOL ) 325 MG tablet, Take 650 mg by mouth as needed for headache., Disp: , Rfl:    amoxicillin -clavulanate (AUGMENTIN) 875-125 MG tablet, Take 1 tablet by mouth every 12  (twelve) hours., Disp: 14 tablet, Rfl: 0   fluticasone (FLONASE) 50 MCG/ACT nasal spray, Place 1 spray into both nostrils daily., Disp: 16 g, Rfl: 2   promethazine -dextromethorphan (PROMETHAZINE -DM) 6.25-15 MG/5ML syrup, Take 5 mLs by mouth 4 (four) times daily as needed for cough., Disp: 118 mL, Rfl: 0  Observations/Objective: Patient is well-developed, well-nourished in no acute distress.  Resting comfortably  at home.  Head is normocephalic, atraumatic.  No labored breathing.  Speech is clear and coherent with logical content.  Patient is alert and oriented at baseline.    Assessment and Plan: 1. Migraine without status migrainosus, not intractable, unspecified migraine type (Primary)  I am not able to make referrals. Will need to see PCP.  Follow Up Instructions: I discussed the assessment and treatment plan with the patient. The patient was provided an opportunity to ask questions and all were answered. The patient agreed with the plan and demonstrated an understanding of the instructions.  A copy of instructions were sent to the patient via MyChart unless otherwise noted below.    The patient was advised to call back or seek an in-person evaluation if the symptoms worsen or if the condition fails to improve as anticipated.    Jon CHRISTELLA Belt, NP

## 2024-03-04 NOTE — Patient Instructions (Signed)
  R[place] [place], thank you for joining Jon CHRISTELLA Belt, NP for today's virtual visit.  While this provider is not your primary care provider (PCP), if your PCP is located in our provider database this encounter information will be shared with them immediately following your visit.   A [facility] MyChart account gives you access to today's visit and all your visits, tests, and labs performed at [facility]  click here if you don't have a [facility] MyChart account or go to [email or web address]  Consent: (Patient) Jaina Holcomb provided verbal consent for this virtual visit at the beginning of the encounter.  Current Medications:  Current Outpatient Medications:    acetaminophen  (TYLENOL ) 325 MG tablet, Take 650 mg by mouth as needed for headache., Disp: , Rfl:    amoxicillin -clavulanate (AUGMENTIN) 875-125 MG tablet, Take 1 tablet by mouth every 12 (twelve) hours., Disp: 14 tablet, Rfl: 0   fluticasone (FLONASE) 50 MCG/ACT nasal spray, Place 1 spray into both nostrils daily., Disp: 16 g, Rfl: 2   promethazine -dextromethorphan (PROMETHAZINE -DM) 6.25-15 MG/5ML syrup, Take 5 mLs by mouth 4 (four) times daily as needed for cough., Disp: 118 mL, Rfl: 0   Medications ordered in this encounter:  No orders of the defined types were placed in this encounter.    *If you need refills on other medications prior to your next appointment, please contact your pharmacy*  Follow-Up: Call back or seek an in-person evaluation if the symptoms worsen or if the condition fails to improve as anticipated.  [facility] [phone]  Other Instructions  Please follow up with Bruna Creighton, NP. I am not able to make referrals from this department.   If you have a headache, Dr. Margaret recommended tylenol  or ibuprofen per package instructions.     If you have been instructed to have an in-person evaluation today at a local Urgent Care facility, please use the link below. It will  take you to a list of all of our available [facility], including address, phone number and hours of operation. Please do not delay care.  [facility]  If you or a family member do not have a primary care provider, use the link below to schedule a visit and establish care. When you choose a [facility] primary care physician or advanced practice provider, you gain a long-term partner in health. Find a Primary Care Provider  Learn more about [facility]'s in-office and virtual care options: [facility] - Get Care Now

## 2024-03-05 ENCOUNTER — Encounter (HOSPITAL_BASED_OUTPATIENT_CLINIC_OR_DEPARTMENT_OTHER): Payer: Self-pay | Admitting: Emergency Medicine

## 2024-03-05 ENCOUNTER — Encounter (HOSPITAL_COMMUNITY): Payer: Self-pay

## 2024-03-05 ENCOUNTER — Emergency Department (HOSPITAL_BASED_OUTPATIENT_CLINIC_OR_DEPARTMENT_OTHER)
Admission: EM | Admit: 2024-03-05 | Discharge: 2024-03-06 | Disposition: A | Payer: Self-pay | Attending: Emergency Medicine | Admitting: Emergency Medicine

## 2024-03-05 ENCOUNTER — Ambulatory Visit (HOSPITAL_COMMUNITY)
Admission: RE | Admit: 2024-03-05 | Discharge: 2024-03-05 | Disposition: A | Discharge status: Home / Self Care | Payer: Self-pay | Source: Ambulatory Visit | Attending: Family Medicine | Admitting: Family Medicine

## 2024-03-05 VITALS — BP 116/76 | HR 77 | Temp 98.1°F | Resp 16

## 2024-03-05 DIAGNOSIS — G43709 Chronic migraine without aura, not intractable, without status migrainosus: Secondary | ICD-10-CM

## 2024-03-05 DIAGNOSIS — G43909 Migraine, unspecified, not intractable, without status migrainosus: Secondary | ICD-10-CM | POA: Insufficient documentation

## 2024-03-05 DIAGNOSIS — G4700 Insomnia, unspecified: Secondary | ICD-10-CM

## 2024-03-05 DIAGNOSIS — M792 Neuralgia and neuritis, unspecified: Secondary | ICD-10-CM

## 2024-03-05 MED ORDER — PROCHLORPERAZINE EDISYLATE 10 MG/2ML IJ SOLN
10.0000 mg | Freq: Once | INTRAMUSCULAR | Status: AC
  Administered 2024-03-05: 10 mg via INTRAVENOUS
  Filled 2024-03-05: qty 2

## 2024-03-05 MED ORDER — DIPHENHYDRAMINE HCL 50 MG/ML IJ SOLN
12.5000 mg | Freq: Once | INTRAMUSCULAR | Status: AC
  Administered 2024-03-05: 12.5 mg via INTRAVENOUS
  Filled 2024-03-05: qty 1

## 2024-03-05 NOTE — ED Provider Notes (Signed)
 Patient reports that she has a heaviness to the top of her head. Patient states, I feel like something is moving, itching, and piercing inside my head. Patient states I can't sleep. I'm agitated and angry. Patient states she has light sensitivity as well. Patient reports that she has been taking Tylenol  PM.  Patient was seen by a neurologist in June of this year for the exact same complaints, apparently this is a chronic condition that she has had for many years.  Patient states she did not return to see the neurologist because the medications he prescribed for her did not work well.  Patient is asking for medication to help her sleep at this time.  Patient advised that given incomplete diagnosis of what appears to be a neurological condition, I would recommend that she go to the emergency room for higher level of care for further evaluation of her complaints and prolonged inability to sleep.   Joesph Shaver Scales, [place] 03/05/24 1948

## 2024-03-05 NOTE — ED Notes (Signed)
 Patient is being discharged from the Urgent Care and sent to the Emergency Department via POV . Per Lindsay,M, PA patient is in need of higher level of care due to chronic headache. Patient is aware and verbalizes understanding of plan of care.  Vitals:   03/05/24 1913  BP: 116/76  Pulse: 77  Resp: 16  Temp: 98.1 F (36.7 C)  SpO2: 99%

## 2024-03-05 NOTE — ED Triage Notes (Signed)
 Patient reports that she has a heaviness to the top of her head. Patient states, I feel like something is moving, itching, and piercing inside my head. Patient states I can't sleep. I'm agitated and angry. Patient states she has light sensitivity as well.  Patient reports that she has been taking Tylenol  PM

## 2024-03-05 NOTE — ED Triage Notes (Signed)
 From UC  Patient reports that she has a heaviness to the top of her head. Patient states, I feel like something is moving, itching, and piercing inside my head. Patient states I can't sleep. I'm agitated and angry. Patient states she has light sensitivity as well.   Patient reports that she has been taking Tylenol  PM   Going on for a while but worse this week  Seen by neuro  3 months ago Put on meds stopped  couldn't sleep

## 2024-03-05 NOTE — ED Provider Notes (Signed)
 [place] [facility] EMERGENCY DEPARTMENT AT [facility] Provider Note   CSN: [id number] Arrival date & time: 03/05/24  2049     Patient presents with: Headache   Mackenzie Reeves is a 38 y.o. female.  {Add pertinent medical, surgical, social history, OB history to HPI:32947} HPI     This is a 38 year old female with a history of migraine who presents with headache.  Patient reports that she at baseline has daily headache.  However over the last week she has had worsening headache.  She describes as a pressure at the top of the head and a feeling that something is crawling in her scalp.  She has seen multiple doctors in the past but reports that nobody has been able to tell me what is going on.  She was taking at one time amitriptyline for the headaches but has taken Tylenol  and ibuprofen with minimal relief..  She describes photosensitivity.  No nausea or vomiting.  Denies focal neurologic deficits.  Patient seen by neurology in June.  Appears that they took a full history.  She has had multiple independent testing since she was 63 and living in [place].  This testing reportedly included imaging and EEG.  Prior to Admission medications   Medication Sig Start Date End Date Taking? Authorizing Provider  acetaminophen  (TYLENOL ) 325 MG tablet Take 650 mg by mouth as needed for headache.    [provider]    Allergies: Nsaids, Diclofenac, and Shrimp extract    Review of Systems  Constitutional:  Negative for fever.  Eyes:  Positive for photophobia.  Respiratory:  Negative for shortness of breath.   Cardiovascular:  Negative for chest pain.  Neurological:  Positive for headaches. Negative for weakness.  All other systems reviewed and are negative.   Updated Vital Signs BP (!) 124/90 (BP Location: Right Arm)   Pulse 82   Temp 97.9 F (36.6 C)   Resp 16   LMP 02/21/2024 (Exact Date)   SpO2 100%   Physical Exam Vitals and nursing note reviewed.  Constitutional:       Appearance: She is well-developed.  HENT:     Head: Normocephalic and atraumatic.  Eyes:     Extraocular Movements: Extraocular movements intact.     Pupils: Pupils are equal, round, and reactive to light.  Cardiovascular:     Rate and Rhythm: Normal rate and regular rhythm.     Heart sounds: Normal heart sounds.  Pulmonary:     Effort: Pulmonary effort is normal. No respiratory distress.     Breath sounds: No wheezing.  Abdominal:     Palpations: Abdomen is soft.  Musculoskeletal:     Cervical back: Neck supple.  Skin:    General: Skin is warm and dry.  Neurological:     Mental Status: She is alert and oriented to person, place, and time.     Comments: Cranial nerves II through XII intact, fluent speech, 5 out of 5 strength in all 4 extremities, no dysmetria to finger-nose-finger     (all labs ordered are listed, but only abnormal results are displayed) Labs Reviewed - No data to display  EKG: None  Radiology: No results found.  {Document cardiac monitor, telemetry assessment procedure when appropriate:32947} Procedures   Medications Ordered in the ED  prochlorperazine (COMPAZINE) injection 10 mg (has no administration in time range)  diphenhydrAMINE  (BENADRYL ) injection 12.5 mg (has no administration in time range)      {Click here for ABCD2, HEART and other calculators REFRESH Note  before signing:1}                              Medical Decision Making Risk Prescription drug management.   ***  {Document critical care time when appropriate  Document review of labs and clinical decision tools ie CHADS2VASC2, etc  Document your independent review of radiology images and any outside records  Document your discussion with family members, caretakers and with consultants  Document social determinants of health affecting pt's care  Document your decision making why or why not admission, treatments were needed:32947:::1}   Final diagnoses:  None    ED Discharge  Orders     None

## 2024-03-06 NOTE — Discharge Instructions (Signed)
 You were seen today for ongoing headache.  Follow-up closely with your neurologist.  Make sure that you are taking medications as previously discussed with your neurologist.

## 2024-03-07 ENCOUNTER — Telehealth: Payer: Self-pay | Admitting: Diagnostic Neuroimaging

## 2024-03-07 NOTE — Telephone Encounter (Signed)
 Patient called in to r/s her appt she no showed last week. States she was very sick. I offered her first available in February, she declined states she is not okay and needs to be seen sooner than February, I offered to place her on the wait list, she declined states she's been to ED multiple times and needs help. I don't have anything sooner to offer pt-is there anything you recommend? Or we can prescribe for her for her recurring migraines? PT was last seen 10/2023

## 2024-03-09 NOTE — Telephone Encounter (Signed)
 I can see patient next Thurs 03/16/24 @ 1:30p. -VRP

## 2024-03-10 NOTE — Telephone Encounter (Signed)
 I called the patient and scheduled the appointment for 03/16/24 at 1:30pm per Dr. Donita.

## 2024-03-16 ENCOUNTER — Encounter: Payer: Self-pay | Admitting: Diagnostic Neuroimaging

## 2024-03-16 ENCOUNTER — Ambulatory Visit (INDEPENDENT_AMBULATORY_CARE_PROVIDER_SITE_OTHER): Payer: Self-pay | Admitting: Diagnostic Neuroimaging

## 2024-03-16 VITALS — BP 100/74 | HR 89 | Ht 64.0 in | Wt 186.4 lb

## 2024-03-16 DIAGNOSIS — F419 Anxiety disorder, unspecified: Secondary | ICD-10-CM

## 2024-03-16 DIAGNOSIS — G43E01 Chronic migraine with aura, not intractable, with status migrainosus: Secondary | ICD-10-CM

## 2024-03-16 MED ORDER — GABAPENTIN 100 MG PO CAPS: 100.0000 mg | ORAL_CAPSULE | Freq: Every day | ORAL | 6 refills | Status: AC

## 2024-03-16 NOTE — Progress Notes (Signed)
 [facility]  PATIENT: Romeo Fells DOB: 02/09/1986  REFERRING CLINICIAN: Petrina Pries, NP HISTORY FROM: patient  REASON FOR VISIT: follow up   HISTORICAL  CHIEF COMPLAINT:  Chief Complaint  Patient presents with   RM 7     Patient is here alone for migraines -  head feels heavy and has movement. 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 Feels pressure on the top of her head with tingling and sharp pain.     HISTORY OF PRESENT ILLNESS:   UPDATE (03/16/24, VRP): Since last visit, continues with sxs. Tried gabapentin  and sertraline , but had worsening insomnia. Then stopped after 4 days. Not seen psychiatry / psychology yet.   PRIOR HPI (10/22/23, VRP): 38 year old female here for evaluation of abnormal sensation / headaches.  Age 76 years old patient was living in [place], with her aunt while she was attending school.  She had some argument with her aunt and had to move out.  Apparently her aunt was giving her a hard time and this resulted in patient having to move back with her father.  Around this time patient also had onset of abnormal sensation in the head, blurred vision, loss of visions, itching sensation in head, racing thoughts.  She saw a neurologist and was diagnosed with migraine.  At some point patient was also tried on Keppra and Tegretol because of an abnormal EEG in [place], but follow-up EEG in the [place]  in 2023 was normal and therefore these medications were discontinued.  Symptoms have continued on a daily basis since that time.  Patient ultimately attended [employer] [facility] [employer] but had to take 2 years off due to difficulty with concentration and ongoing headaches.  Eventually patient moved to the [place]  to attend masters degree program which she has completed.  She has been able to complete her work but with great difficulty.  Now she is in the process of applying for jobs but having difficulty during interviews with focus and concentration due to her abnormal  sensations in the head.  Patient continues to feel itching, moving, burning and uncomfortable sensation in the scalp and head.  [facility] and had trigger point injections without relief.  She also saw headache specialist Dr. Rush, but patient was pregnant at the time and was not able to try that any migraine medications.  Patient does recall trying topiramate, amitriptyline in the past without relief.  More recently has tried Qulipta  and Nurtec without relief.  Still having ongoing racing thoughts and anxiety.  She also feels depression due to her ongoing headache and abnormal sensations in the head.  Patient lives with her 74-year-old daughter.  She does not have any other family in the [place] .   REVIEW OF SYSTEMS: Full 14 system review of systems performed and negative with exception of: as per HPI.  ALLERGIES: Allergies  Allergen Reactions   Nsaids Shortness Of Breath, Swelling and Rash    Facial swelling, difficulty breathing, rashes   Other Other (See Comments) and Hives   Diclofenac Hives, Itching and Rash   Ibuprofen Rash    Zyrtec helps with reactions    Shrimp Extract Rash    HOME MEDICATIONS: Outpatient Medications Prior to Visit  Medication Sig Dispense Refill   acetaminophen  (TYLENOL ) 325 MG tablet Take 650 mg by mouth as needed for headache.     No facility-administered medications prior to visit.    PAST MEDICAL HISTORY: Past Medical History:  Diagnosis Date   Fibroid  Headache     PAST SURGICAL HISTORY: Past Surgical History:  Procedure Laterality Date   CESAREAN SECTION     CESAREAN SECTION N/A 01/06/2022   Procedure: CESAREAN SECTION;  Surgeon: Delana Ted Morrison, DO;  Location: [facility];  Service: Obstetrics;  Laterality: N/A;   MYOMECTOMY      FAMILY HISTORY: Family History  Problem Relation Age of Onset   Headache Mother    Headache Brother     SOCIAL HISTORY: Social History   Socioeconomic  History   Marital status: Legally Separated    Spouse name: Not on file   Number of children: 2   Years of education: Not on file   Highest education level: Not on file  Occupational History   Not on file  Tobacco Use   Smoking status: Never    Passive exposure: Never   Smokeless tobacco: Never  Vaping Use   Vaping status: Never Used  Substance and Sexual Activity   Alcohol use: Never   Drug use: Never   Sexual activity: Yes    Birth control/protection: None  Other Topics Concern   Not on file  Social History Narrative   Lives w son and daughter   R handed   Caffeine: none   Social Drivers of Corporate Investment Banker Strain: Not on file  Food Insecurity: Not on file  Transportation Needs: Not on file  Physical Activity: Not on file  Stress: Not on file  Social Connections: Unknown (09/02/2021)   Received from [facility]   Social Network    Social Network: Not on file  Intimate Partner Violence: Unknown (08/27/2021)   Received from [facility]   HITS    Physically Hurt: Not on file    Insult or Talk Down To: Not on file    Threaten Physical Harm: Not on file    Scream or Curse: Not on file     PHYSICAL EXAM  GENERAL EXAM/CONSTITUTIONAL: Vitals:  Vitals:   03/16/24 1355  BP: 100/74  Pulse: 89  SpO2: 99%  Weight: 186 lb 6.4 oz (84.6 kg)  Height: 5' 4 (1.626 m)   Body mass index is 32 kg/m. Wt Readings from Last 3 Encounters:  03/16/24 186 lb 6.4 oz (84.6 kg)  03/06/24 182 lb 15.7 oz (83 kg)  10/22/23 183 lb (83 kg)   Patient is in no distress; well developed, nourished and groomed; neck is supple  CARDIOVASCULAR: Examination of carotid arteries is normal; no carotid bruits Regular rate and rhythm, no murmurs Examination of peripheral vascular system by observation and palpation is normal  EYES: Ophthalmoscopic exam of optic discs and posterior segments is normal; no papilledema or hemorrhages No results found.  MUSCULOSKELETAL: Gait,  strength, tone, movements noted in Neurologic exam below  NEUROLOGIC: MENTAL STATUS:      No data to display         awake, alert, oriented to person, place and time recent and remote memory intact normal attention and concentration language fluent, comprehension intact, naming intact fund of knowledge appropriate  CRANIAL NERVE:  2nd - no papilledema on fundoscopic exam 2nd, 3rd, 4th, 6th - pupils equal and reactive to light, visual fields full to confrontation, extraocular muscles intact, no nystagmus 5th - facial sensation symmetric 7th - facial strength symmetric 8th - hearing intact 9th - palate elevates symmetrically, uvula midline 11th - shoulder shrug symmetric 12th - tongue protrusion midline  MOTOR:  normal bulk and tone, full strength in the BUE, BLE  SENSORY:  normal and symmetric to light touch, temperature, vibration  COORDINATION:  finger-nose-finger, fine finger movements normal  REFLEXES:  deep tendon reflexes 1+ and symmetric  GAIT/STATION:  narrow based gait     DIAGNOSTIC DATA (LABS, IMAGING, TESTING) - I reviewed patient records, labs, notes, testing and imaging myself where available.  Lab Results  Component Value Date   WBC 6.3 07/07/2023   HGB 12.0 07/07/2023   HCT 37.5 07/07/2023   MCV 83 07/07/2023   PLT 216 07/07/2023      Component Value Date/Time   NA 141 07/07/2023 1509   K 4.2 07/07/2023 1509   CL 104 07/07/2023 1509   CO2 22 07/07/2023 1509   GLUCOSE 80 07/07/2023 1509   BUN 9 07/07/2023 1509   CREATININE 0.65 07/07/2023 1509   CALCIUM 9.4 07/07/2023 1509   PROT 7.5 07/07/2023 1509   ALBUMIN 4.5 07/07/2023 1509   AST 14 07/07/2023 1509   ALT 17 07/07/2023 1509   ALKPHOS 80 07/07/2023 1509   BILITOT 0.8 07/07/2023 1509   No results found for: CHOL, HDL, LDLCALC, LDLDIRECT, TRIG, CHOLHDL No results found for: YHAJ8R No results found for: VITAMINB12 No results found for: TSH  09/24/21 MRI  brain 1.  No acute intracranial abnormality.   09/12/21 MRV head - Unremarkable MRV of the head.    ASSESSMENT AND PLAN  38 y.o. year old female here with:  Meds tried: topiramate, amitriptyline, tramadol , nurtec, qulipta , sertraline , gabapentin   Dx:  1. 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 Anxiety     PLAN:  ABNORMAL HEAD / SCALP SENSATIONS --> CHRONIC MIGRAINE + ANXIETY -Decrease or avoid caffeine / alcohol -Eat and sleep on a regular schedule -Exercise several times per week - gabapentin  100mg  at bedtime; increase up to 300mg  at bedtime as tolerated  MIGRAINE RESCUE  - ibuprofen, tylenol  as needed  ANXIETY DISORDER - follow up with PCP; consider psychiatry / psychology evaluation  Meds ordered this encounter  Medications   gabapentin  (NEURONTIN ) 100 MG capsule    Sig: Take 1-3 capsules (100-300 mg total) by mouth at bedtime.    Dispense:  90 capsule    Refill:  6   Return for return to PCP, pending if symptoms worsen or fail to improve.    EDUARD FABIENE HANLON, MD 03/16/2024, 2:19 PM Certified in Neurology, Neurophysiology and Neuroimaging  [facility] [address], [address] [place], [place] [address] [phone]

## 2024-03-16 NOTE — Patient Instructions (Signed)
  ABNORMAL HEAD / SCALP SENSATIONS --> CHRONIC MIGRAINE + ANXIETY -Decrease or avoid caffeine / alcohol -Eat and sleep on a regular schedule -Exercise several times per week - gabapentin  100mg  at bedtime; increase up to 300mg  at bedtime as tolerated  MIGRAINE RESCUE  - ibuprofen, tylenol  as needed  ANXIETY DISORDER - follow up with PCP; consider psychiatry / psychology evaluation

## 2024-05-19 ENCOUNTER — Telehealth: Payer: Self-pay | Admitting: Family

## 2024-05-19 DIAGNOSIS — J0100 Acute maxillary sinusitis, unspecified: Secondary | ICD-10-CM

## 2024-05-19 MED ORDER — AMOXICILLIN-POT CLAVULANATE 875-125 MG PO TABS: 1.0000 | ORAL_TABLET | Freq: Two times a day (BID) | ORAL | 0 refills | Status: AC

## 2024-05-19 MED ORDER — CETIRIZINE HCL 10 MG PO TABS: 10.0000 mg | ORAL_TABLET | Freq: Every day | ORAL | 1 refills | Status: AC

## 2024-05-19 MED ORDER — FLUTICASONE PROPIONATE 50 MCG/ACT NA SUSP: 2.0000 | Freq: Every day | NASAL | 6 refills | Status: AC

## 2024-05-19 NOTE — Progress Notes (Signed)
 " Virtual Visit Consent   Mackenzie Reeves, you are scheduled for a virtual visit with a [facility] provider today. Just as with appointments in the office, your consent must be obtained to participate. Your consent will be active for this visit and any virtual visit you may have with one of our providers in the next 365 days. If you have a MyChart account, a copy of this consent can be sent to you electronically.  As this is a virtual visit, video technology does not allow for your provider to perform a traditional examination. This may limit your provider's ability to fully assess your condition. If your provider identifies any concerns that need to be evaluated in person or the need to arrange testing (such as labs, EKG, etc.), we will make arrangements to do so. Although advances in technology are sophisticated, we cannot ensure that it will always work on either your end or our end. If the connection with a video visit is poor, the visit may have to be switched to a telephone visit. With either a video or telephone visit, we are not always able to ensure that we have a secure connection.  By engaging in this virtual visit, you consent to the provision of healthcare and authorize for your insurance to be billed (if applicable) for the services provided during this visit. Depending on your insurance coverage, you may receive a charge related to this service.  I need to obtain your verbal consent now. Are you willing to proceed with your visit today? Mackenzie Reeves has provided verbal consent on 05/19/2024 for a virtual visit (video or telephone). Bari Learn, FNP  Date: 05/19/2024 7:27 PM   Virtual Visit via Video Note   I, Bari Learn, connected with  Mackenzie Reeves  ([id number], 1985-09-13) on 05/19/24 at  7:30 PM EST by a video-enabled telemedicine application and verified that I am speaking with the correct person using two identifiers.  Location: Patient: Virtual Visit Location Patient:  Home Provider: Virtual Visit Location Provider: Home Office   I discussed the limitations of evaluation and management by telemedicine and the availability of in person appointments. The patient expressed understanding and agreed to proceed.    History of Present Illness: Mackenzie Reeves is a 39 y.o. who identifies as a female who was assigned female at birth, and is being seen today for facial pain and congestion that started 3 days ago. Prior to that she has had nasal congestion and headaches several days prior. Reports brown mucus.   HPI: Sinusitis This is a new problem. The current episode started in the past 7 days. The problem has been gradually worsening since onset. Her pain is at a severity of 10/10. The pain is severe. Associated symptoms include chills, congestion, headaches, a hoarse voice, sinus pressure, sneezing and a sore throat. Pertinent negatives include no ear pain or shortness of breath. Past treatments include acetaminophen  and oral decongestants (motrin). The treatment provided mild relief.    Problems:  Patient Active Problem List   Diagnosis Date Noted   Establishing care with new doctor, encounter for 07/12/2023   Forgetfulness 07/12/2023   Vitamin D  deficiency 07/07/2023   Acute blood loss anemia 01/08/2022   Postpartum care following cesarean delivery 01/06/2022   Supervision of other normal pregnancy, antepartum 07/16/2021   Migraines 07/16/2021   History of cesarean delivery 07/16/2021    Allergies: Allergies[1] Medications: Current Medications[2]  Observations/Objective: Patient is well-developed, well-nourished in no acute distress.  Resting comfortably  at home.  Head is normocephalic, atraumatic.  No labored breathing.  Speech is clear and coherent with logical content.  Patient is alert and oriented at baseline.  Nasal congestion  Maxillary pain  Assessment and Plan: 1. Acute non-recurrent maxillary sinusitis (Primary) - amoxicillin -clavulanate  (AUGMENTIN ) 875-125 MG tablet; Take 1 tablet by mouth 2 (two) times daily.  Dispense: 14 tablet; Refill: 0 - fluticasone  (FLONASE ) 50 MCG/ACT nasal spray; Place 2 sprays into both nostrils daily.  Dispense: 16 g; Refill: 6 - cetirizine  (ZYRTEC  ALLERGY) 10 MG tablet; Take 1 tablet (10 mg total) by mouth daily.  Dispense: 90 tablet; Refill: 1  - Take meds as prescribed - Use a cool mist humidifier  -Use saline nose sprays frequently -Force fluids -For any cough or congestion  Use plain Mucinex- regular strength or max strength is fine -For fever or aces or pains- take tylenol  or ibuprofen. -Throat lozenges if help -Follow up if symptoms worsen or do not improve   Follow Up Instructions: I discussed the assessment and treatment plan with the patient. The patient was provided an opportunity to ask questions and all were answered. The patient agreed with the plan and demonstrated an understanding of the instructions.  A copy of instructions were sent to the patient via MyChart unless otherwise noted below.     The patient was advised to call back or seek an in-person evaluation if the symptoms worsen or if the condition fails to improve as anticipated.    Bari Learn, FNP    [1]  Allergies Allergen Reactions   Nsaids Shortness Of Breath, Swelling and Rash    Facial swelling, difficulty breathing, rashes   Other Other (See Comments) and Hives   Diclofenac Hives, Itching and Rash   Ibuprofen Rash    Zyrtec  helps with reactions    Shrimp Extract Rash  [2]  Current Outpatient Medications:    amoxicillin -clavulanate (AUGMENTIN ) 875-125 MG tablet, Take 1 tablet by mouth 2 (two) times daily., Disp: 14 tablet, Rfl: 0   cetirizine  (ZYRTEC  ALLERGY) 10 MG tablet, Take 1 tablet (10 mg total) by mouth daily., Disp: 90 tablet, Rfl: 1   fluticasone  (FLONASE ) 50 MCG/ACT nasal spray, Place 2 sprays into both nostrils daily., Disp: 16 g, Rfl: 6   acetaminophen  (TYLENOL ) 325 MG tablet, Take 650 mg  by mouth as needed for headache., Disp: , Rfl:    gabapentin  (NEURONTIN ) 100 MG capsule, Take 1-3 capsules (100-300 mg total) by mouth at bedtime., Disp: 90 capsule, Rfl: 6  "

## 2024-05-19 NOTE — Patient Instructions (Signed)

## 2024-06-10 ENCOUNTER — Ambulatory Visit (HOSPITAL_COMMUNITY): Payer: Self-pay | Admitting: Psychiatry
